# Patient Record
Sex: Male | Born: 1970 | Race: White | Hispanic: No | Marital: Single | Smoking: Current every day smoker
Health system: Southern US, Community
[De-identification: ages and names within clinical notes are randomized; demographics above are authoritative.]

## PROBLEM LIST (undated history)

## (undated) DIAGNOSIS — E34328 Other genetic causes of short stature: Secondary | ICD-10-CM

## (undated) DIAGNOSIS — N183 Chronic kidney disease, stage 3 unspecified: Secondary | ICD-10-CM

## (undated) DIAGNOSIS — I10 Essential (primary) hypertension: Secondary | ICD-10-CM

## (undated) HISTORY — PX: CHOLECYSTECTOMY: SHX55

---

## 2001-01-02 ENCOUNTER — Emergency Department (HOSPITAL_COMMUNITY): Admission: EM | Admit: 2001-01-02 | Discharge: 2001-01-02 | Payer: Self-pay | Admitting: Emergency Medicine

## 2001-01-02 ENCOUNTER — Encounter: Payer: Self-pay | Admitting: Emergency Medicine

## 2001-04-26 ENCOUNTER — Encounter: Payer: Self-pay | Admitting: Emergency Medicine

## 2001-04-26 ENCOUNTER — Emergency Department (HOSPITAL_COMMUNITY): Admission: EM | Admit: 2001-04-26 | Discharge: 2001-04-26 | Payer: Self-pay | Admitting: Emergency Medicine

## 2005-02-15 ENCOUNTER — Emergency Department (HOSPITAL_COMMUNITY): Admission: EM | Admit: 2005-02-15 | Discharge: 2005-02-15 | Payer: Self-pay | Admitting: Emergency Medicine

## 2005-05-08 ENCOUNTER — Emergency Department (HOSPITAL_COMMUNITY): Admission: EM | Admit: 2005-05-08 | Discharge: 2005-05-08 | Payer: Self-pay | Admitting: Emergency Medicine

## 2005-05-18 ENCOUNTER — Encounter: Admission: RE | Admit: 2005-05-18 | Discharge: 2005-05-18 | Payer: Self-pay | Admitting: Orthopedic Surgery

## 2005-06-30 ENCOUNTER — Encounter: Admission: RE | Admit: 2005-06-30 | Discharge: 2005-06-30 | Payer: Self-pay | Admitting: Orthopaedic Surgery

## 2009-03-12 ENCOUNTER — Emergency Department (HOSPITAL_COMMUNITY): Admission: EM | Admit: 2009-03-12 | Discharge: 2009-03-12 | Payer: Self-pay | Admitting: Emergency Medicine

## 2012-10-28 ENCOUNTER — Emergency Department: Payer: Self-pay | Admitting: Emergency Medicine

## 2013-09-16 ENCOUNTER — Inpatient Hospital Stay: Payer: Self-pay | Admitting: Surgery

## 2013-09-16 LAB — CBC
MCH: 30.4 pg (ref 26.0–34.0)
MCHC: 35.6 g/dL (ref 32.0–36.0)
MCV: 86 fL (ref 80–100)
Platelet: 359 10*3/uL (ref 150–440)
RDW: 12.6 % (ref 11.5–14.5)

## 2013-09-16 LAB — COMPREHENSIVE METABOLIC PANEL
Albumin: 4.1 g/dL (ref 3.4–5.0)
Alkaline Phosphatase: 167 U/L — ABNORMAL HIGH (ref 50–136)
Bilirubin,Total: 1.7 mg/dL — ABNORMAL HIGH (ref 0.2–1.0)
Chloride: 101 mmol/L (ref 98–107)
Co2: 27 mmol/L (ref 21–32)
EGFR (African American): >60
EGFR (Non-African Amer.): >60
Osmolality: 268 (ref 275–301)
SGOT(AST): 210 U/L — ABNORMAL HIGH (ref 15–37)

## 2013-09-16 LAB — URINALYSIS, COMPLETE
Bacteria: NONE SEEN
Blood: NEGATIVE
Glucose,UR: NEGATIVE mg/dL (ref 0–75)
Leukocyte Esterase: NEGATIVE
Nitrite: NEGATIVE
Specific Gravity: 1.019 (ref 1.003–1.030)
WBC UR: NONE SEEN /HPF (ref 0–5)

## 2013-09-17 LAB — CBC WITH DIFFERENTIAL/PLATELET
Basophil %: 0.8 %
HGB: 15.4 g/dL (ref 13.0–18.0)
Lymphocyte #: 1 10*3/uL (ref 1.0–3.6)
Monocyte #: 1.2 x10 3/mm — ABNORMAL HIGH (ref 0.2–1.0)
Neutrophil %: 80.2 %
RBC: 4.95 10*6/uL (ref 4.40–5.90)
RDW: 12.5 % (ref 11.5–14.5)

## 2013-09-17 LAB — COMPREHENSIVE METABOLIC PANEL
Anion Gap: 6 — ABNORMAL LOW (ref 7–16)
Bilirubin,Total: 5.6 mg/dL — ABNORMAL HIGH (ref 0.2–1.0)
Chloride: 98 mmol/L (ref 98–107)
Co2: 27 mmol/L (ref 21–32)
Creatinine: 0.68 mg/dL (ref 0.60–1.30)
SGPT (ALT): 333 U/L — ABNORMAL HIGH (ref 12–78)

## 2013-09-18 LAB — COMPREHENSIVE METABOLIC PANEL
Albumin: 3.2 g/dL — ABNORMAL LOW (ref 3.4–5.0)
Alkaline Phosphatase: 306 U/L — ABNORMAL HIGH (ref 50–136)
Calcium, Total: 8.8 mg/dL (ref 8.5–10.1)
Co2: 24 mmol/L (ref 21–32)
EGFR (African American): >60
Glucose: 87 mg/dL (ref 65–99)
Osmolality: 268 (ref 275–301)
Potassium: 3.7 mmol/L (ref 3.5–5.1)
Sodium: 135 mmol/L — ABNORMAL LOW (ref 136–145)
Total Protein: 6.7 g/dL (ref 6.4–8.2)

## 2013-09-18 LAB — CBC WITH DIFFERENTIAL/PLATELET
Basophil #: 0 10*3/uL (ref 0.0–0.1)
Basophil %: 0.4 %
Eosinophil %: 0.7 %
HGB: 13.6 g/dL (ref 13.0–18.0)
Lymphocyte %: 8.7 %
MCH: 30.9 pg (ref 26.0–34.0)
MCHC: 35.7 g/dL (ref 32.0–36.0)
Monocyte %: 8.7 %
Neutrophil %: 81.5 %
Platelet: 287 10*3/uL (ref 150–440)
RBC: 4.41 10*6/uL (ref 4.40–5.90)
RDW: 12.8 % (ref 11.5–14.5)

## 2013-09-18 LAB — PROTIME-INR
INR: 1.1
Prothrombin Time: 14 secs (ref 11.5–14.7)

## 2013-09-18 LAB — AMYLASE: Amylase: 673 U/L — ABNORMAL HIGH (ref 25–115)

## 2013-09-18 LAB — LIPASE, BLOOD: Lipase: 4956 U/L — ABNORMAL HIGH (ref 73–393)

## 2013-09-19 LAB — CBC WITH DIFFERENTIAL/PLATELET
Basophil %: 0.8 %
HCT: 36.3 % — ABNORMAL LOW (ref 40.0–52.0)
Lymphocyte %: 18.2 %
MCH: 31 pg (ref 26.0–34.0)
MCHC: 35.5 g/dL (ref 32.0–36.0)
Neutrophil #: 3.8 10*3/uL (ref 1.4–6.5)
Neutrophil %: 67.6 %

## 2013-09-19 LAB — COMPREHENSIVE METABOLIC PANEL
Albumin: 2.9 g/dL — ABNORMAL LOW (ref 3.4–5.0)
Anion Gap: 8 (ref 7–16)
Calcium, Total: 8.5 mg/dL (ref 8.5–10.1)
Chloride: 107 mmol/L (ref 98–107)
Co2: 24 mmol/L (ref 21–32)
Creatinine: 0.68 mg/dL (ref 0.60–1.30)
EGFR (Non-African Amer.): >60
Glucose: 73 mg/dL (ref 65–99)
Osmolality: 274 (ref 275–301)
SGOT(AST): 127 U/L — ABNORMAL HIGH (ref 15–37)
SGPT (ALT): 220 U/L — ABNORMAL HIGH (ref 12–78)

## 2013-09-19 LAB — AMYLASE: Amylase: 161 U/L — ABNORMAL HIGH (ref 25–115)

## 2013-09-20 LAB — COMPREHENSIVE METABOLIC PANEL
Alkaline Phosphatase: 251 U/L — ABNORMAL HIGH (ref 50–136)
Anion Gap: 6 — ABNORMAL LOW (ref 7–16)
Creatinine: 0.67 mg/dL (ref 0.60–1.30)
EGFR (African American): >60
EGFR (Non-African Amer.): >60
Glucose: 78 mg/dL (ref 65–99)
Osmolality: 275 (ref 275–301)
Potassium: 3.5 mmol/L (ref 3.5–5.1)
SGOT(AST): 101 U/L — ABNORMAL HIGH (ref 15–37)
SGPT (ALT): 212 U/L — ABNORMAL HIGH (ref 12–78)
Sodium: 140 mmol/L (ref 136–145)
Total Protein: 6.4 g/dL (ref 6.4–8.2)

## 2013-09-20 LAB — CBC WITH DIFFERENTIAL/PLATELET
Basophil #: 0.1 10*3/uL (ref 0.0–0.1)
HCT: 36 % — ABNORMAL LOW (ref 40.0–52.0)
HGB: 12.8 g/dL — ABNORMAL LOW (ref 13.0–18.0)
Lymphocyte %: 20.7 %
Monocyte #: 0.4 x10 3/mm (ref 0.2–1.0)
Neutrophil #: 3.3 10*3/uL (ref 1.4–6.5)
Neutrophil %: 64 %
WBC: 5.2 10*3/uL (ref 3.8–10.6)

## 2013-09-21 LAB — COMPREHENSIVE METABOLIC PANEL
Co2: 28 mmol/L (ref 21–32)
EGFR (African American): >60
Glucose: 90 mg/dL (ref 65–99)
Sodium: 137 mmol/L (ref 136–145)
Total Protein: 7 g/dL (ref 6.4–8.2)

## 2013-09-22 LAB — CBC WITH DIFFERENTIAL/PLATELET
Basophil %: 0.9 %
Eosinophil #: 0.3 10*3/uL (ref 0.0–0.7)
Eosinophil %: 5.4 %
MCH: 30.8 pg (ref 26.0–34.0)
Monocyte #: 0.5 x10 3/mm (ref 0.2–1.0)
Monocyte %: 7.7 %
Neutrophil %: 65.4 %
RDW: 12.5 % (ref 11.5–14.5)
WBC: 6.1 10*3/uL (ref 3.8–10.6)

## 2013-09-22 LAB — LIPASE, BLOOD: Lipase: 2845 U/L — ABNORMAL HIGH (ref 73–393)

## 2013-09-22 LAB — COMPREHENSIVE METABOLIC PANEL
Bilirubin,Total: 1.1 mg/dL — ABNORMAL HIGH (ref 0.2–1.0)
Calcium, Total: 9.2 mg/dL (ref 8.5–10.1)
Creatinine: 0.77 mg/dL (ref 0.60–1.30)
EGFR (African American): >60
EGFR (Non-African Amer.): >60
Glucose: 97 mg/dL (ref 65–99)
Osmolality: 269 (ref 275–301)
Potassium: 3.9 mmol/L (ref 3.5–5.1)
SGPT (ALT): 225 U/L — ABNORMAL HIGH (ref 12–78)

## 2013-09-23 LAB — COMPREHENSIVE METABOLIC PANEL
Albumin: 3 g/dL — ABNORMAL LOW (ref 3.4–5.0)
Alkaline Phosphatase: 217 U/L — ABNORMAL HIGH (ref 50–136)
Bilirubin,Total: 1.1 mg/dL — ABNORMAL HIGH (ref 0.2–1.0)
Calcium, Total: 8.7 mg/dL (ref 8.5–10.1)
Chloride: 105 mmol/L (ref 98–107)
Co2: 25 mmol/L (ref 21–32)
Creatinine: 0.73 mg/dL (ref 0.60–1.30)
EGFR (Non-African Amer.): >60
Glucose: 82 mg/dL (ref 65–99)
Osmolality: 272 (ref 275–301)
SGOT(AST): 130 U/L — ABNORMAL HIGH (ref 15–37)
Sodium: 137 mmol/L (ref 136–145)

## 2013-09-23 LAB — LIPASE, BLOOD: Lipase: 760 U/L — ABNORMAL HIGH (ref 73–393)

## 2013-09-23 LAB — CBC WITH DIFFERENTIAL/PLATELET
Basophil #: 0.1 10*3/uL (ref 0.0–0.1)
Eosinophil #: 0.1 10*3/uL (ref 0.0–0.7)
HCT: 40.4 % (ref 40.0–52.0)
HGB: 14.3 g/dL (ref 13.0–18.0)
Lymphocyte %: 11.3 %
MCH: 31 pg (ref 26.0–34.0)
Monocyte #: 0.6 x10 3/mm (ref 0.2–1.0)
Neutrophil #: 7.8 10*3/uL — ABNORMAL HIGH (ref 1.4–6.5)
RDW: 12.4 % (ref 11.5–14.5)
WBC: 9.6 10*3/uL (ref 3.8–10.6)

## 2013-09-24 LAB — COMPREHENSIVE METABOLIC PANEL
Anion Gap: 5 — ABNORMAL LOW (ref 7–16)
BUN: 6 mg/dL — ABNORMAL LOW (ref 7–18)
Calcium, Total: 8.8 mg/dL (ref 8.5–10.1)
Chloride: 104 mmol/L (ref 98–107)
Creatinine: 0.74 mg/dL (ref 0.60–1.30)
EGFR (Non-African Amer.): >60
Osmolality: 265 (ref 275–301)
Total Protein: 7 g/dL (ref 6.4–8.2)

## 2013-09-24 LAB — CBC WITH DIFFERENTIAL/PLATELET
Basophil %: 0.4 %
MCHC: 34.7 g/dL (ref 32.0–36.0)
MCV: 88 fL (ref 80–100)
Monocyte #: 0.6 x10 3/mm (ref 0.2–1.0)
Monocyte %: 6.3 %
Neutrophil #: 7.9 10*3/uL — ABNORMAL HIGH (ref 1.4–6.5)
Neutrophil %: 81.8 %
RBC: 4.74 10*6/uL (ref 4.40–5.90)
RDW: 12.5 % (ref 11.5–14.5)

## 2013-09-25 LAB — COMPREHENSIVE METABOLIC PANEL
BUN: 8 mg/dL (ref 7–18)
Chloride: 103 mmol/L (ref 98–107)
Creatinine: 0.92 mg/dL (ref 0.60–1.30)
EGFR (African American): >60
EGFR (Non-African Amer.): >60
Glucose: 105 mg/dL — ABNORMAL HIGH (ref 65–99)
Potassium: 3.9 mmol/L (ref 3.5–5.1)
SGOT(AST): 44 U/L — ABNORMAL HIGH (ref 15–37)
SGPT (ALT): 151 U/L — ABNORMAL HIGH (ref 12–78)
Sodium: 135 mmol/L — ABNORMAL LOW (ref 136–145)

## 2013-09-25 LAB — CBC WITH DIFFERENTIAL/PLATELET
Basophil %: 0.5 %
Eosinophil #: 0.3 10*3/uL (ref 0.0–0.7)
Eosinophil %: 3.2 %
HGB: 14.8 g/dL (ref 13.0–18.0)
MCHC: 35.6 g/dL (ref 32.0–36.0)
Monocyte %: 7.5 %
RBC: 4.78 10*6/uL (ref 4.40–5.90)
RDW: 12.5 % (ref 11.5–14.5)
WBC: 10.2 10*3/uL (ref 3.8–10.6)

## 2013-09-26 LAB — COMPREHENSIVE METABOLIC PANEL
Albumin: 3.1 g/dL — ABNORMAL LOW (ref 3.4–5.0)
Alkaline Phosphatase: 164 U/L — ABNORMAL HIGH (ref 50–136)
Bilirubin,Total: 0.8 mg/dL (ref 0.2–1.0)
Creatinine: 0.75 mg/dL (ref 0.60–1.30)
EGFR (African American): >60
EGFR (Non-African Amer.): >60
Glucose: 96 mg/dL (ref 65–99)
Osmolality: 276 (ref 275–301)
SGPT (ALT): 105 U/L — ABNORMAL HIGH (ref 12–78)
Total Protein: 7.6 g/dL (ref 6.4–8.2)

## 2013-09-26 LAB — LIPASE, BLOOD: Lipase: 951 U/L — ABNORMAL HIGH (ref 73–393)

## 2013-09-27 LAB — PATHOLOGY REPORT

## 2013-09-27 LAB — LIPASE, BLOOD: Lipase: 1211 U/L — ABNORMAL HIGH (ref 73–393)

## 2015-03-16 NOTE — Consult Note (Signed)
Chief Complaint:  Subjective/Chief Complaint Pt denies abdominal pain, nausea, or vomiting.  No jaundice or pruritis.  Lipase 1285.  LFTS improving.  Tolerating full iquids well.  1 soft brown BM this am.   VITAL SIGNS/ANCILLARY NOTES: **Vital Signs.:   29-Oct-14 05:43  Vital Signs Type Routine  Temperature Temperature (F) 97.2  Celsius 36.2  Pulse Pulse 78  Respirations Respirations 19  Systolic BP Systolic BP 606  Diastolic BP (mmHg) Diastolic BP (mmHg) 89  Mean BP 113  Pulse Ox % Pulse Ox % 95  Pulse Ox Activity Level  At rest  Oxygen Delivery Room Air/ 21 %   Brief Assessment:  GEN well developed, well nourished, no acute distress, A/Ox3   Cardiac Regular   Respiratory normal resp effort   Gastrointestinal Normal   Gastrointestinal details normal Soft  Nontender  Nondistended  Bowel sounds normal  No rebound tenderness  No gaurding  No rigidity  No organomegaly   EXTR negative cyanosis/clubbing, negative edema   Additional Physical Exam Skin: warm, dry   Lab Results: Hepatic:  29-Oct-14 04:18   Bilirubin, Total  1.3  Alkaline Phosphatase  253  SGPT (ALT)  231  SGOT (AST)  118  Total Protein, Serum 7.0  Albumin, Serum  3.2  Routine Chem:  29-Oct-14 04:18   Glucose, Serum 90  BUN  5  Creatinine (comp) 0.78  Sodium, Serum 137  Potassium, Serum 3.6  Chloride, Serum 105  CO2, Serum 28  Calcium (Total), Serum 9.1  Osmolality (calc) 271  eGFR (African American) >60  eGFR (Non-African American) >60 (eGFR values <77m/min/1.73 m2 may be an indication of chronic kidney disease (CKD). Calculated eGFR is useful in patients with stable renal function. The eGFR calculation will not be reliable in acutely ill patients when serum creatinine is changing rapidly. It is not useful in  patients on dialysis. The eGFR calculation may not be applicable to patients at the low and high extremes of body sizes, pregnant women, and vegetarians.)  Anion Gap  4  Lipase  1285  (Result(s) reported on 21 Sep 2013 at 04:59AM.)   Assessment/Plan:  Assessment/Plan:  Assessment Gallstone pancreatitis: Resolving.  Clinically doing very well, however lipase & LFTs slowly trending down.   Plan 1) Continue protonix  2) Pt for cholecystectomy with IOC soon Please call if you have any questions or concerns   Electronic Signatures: JAndria Meuse(NP)  (Signed 29-Oct-14 10:47)  Authored: Chief Complaint, VITAL SIGNS/ANCILLARY NOTES, Brief Assessment, Lab Results, Assessment/Plan   Last Updated: 29-Oct-14 10:47 by JAndria Meuse(NP)

## 2015-03-16 NOTE — Consult Note (Signed)
Chief Complaint:  Subjective/Chief Complaint The patient denies pain today. His lipase was up with some of his LFT's down today. MRCP did not show a filling defect but did show dilation of CBD and PD.   VITAL SIGNS/ANCILLARY NOTES: **Vital Signs.:   26-Oct-14 05:14  Vital Signs Type Routine  Temperature Temperature (F) 99.2  Celsius 37.3  Temperature Source oral  Pulse Pulse 100  Respirations Respirations 18  Systolic BP Systolic BP 409  Diastolic BP (mmHg) Diastolic BP (mmHg) 83  Mean BP 110  Pulse Ox % Pulse Ox % 97  Pulse Ox Activity Level  At rest  Oxygen Delivery Room Air/ 21 %  *Intake and Output.:   Shift 26-Oct-14 15:00  Grand Totals Intake:  960 Output:      Net:  960 24 Hr.:  960  Oral Intake      In:  960  Length of Stay Totals Intake:  4898.7 Output:  1300    Net:  3598.7   Brief Assessment:  GEN well developed, well nourished, no acute distress   Respiratory normal resp effort   Gastrointestinal Normal   Additional Physical Exam Alert and orientated times 3   Lab Results: Hepatic:  26-Oct-14 04:27   Bilirubin, Total  6.8  Alkaline Phosphatase  306  SGPT (ALT)  260  SGOT (AST)  171  Total Protein, Serum 6.7  Albumin, Serum  3.2  Routine Chem:  26-Oct-14 04:27   Amylase, Serum  673 (Result(s) reported on 18 Sep 2013 at 05:59AM.)  Lipase  4956 (Result(s) reported on 18 Sep 2013 at 05:59AM.)  Glucose, Serum 87  BUN 8  Creatinine (comp) 0.78  Sodium, Serum  135  Potassium, Serum 3.7  Chloride, Serum 103  CO2, Serum 24  Calcium (Total), Serum 8.8  Osmolality (calc) 268  eGFR (African American) >60  eGFR (Non-African American) >60 (eGFR values <78m/min/1.73 m2 may be an indication of chronic kidney disease (CKD). Calculated eGFR is useful in patients with stable renal function. The eGFR calculation will not be reliable in acutely ill patients when serum creatinine is changing rapidly. It is not useful in  patients on dialysis. The eGFR  calculation may not be applicable to patients at the low and high extremes of body sizes, pregnant women, and vegetarians.)  Anion Gap 8  Routine Coag:  26-Oct-14 04:27   Prothrombin 14.0  INR 1.1 (INR reference interval applies to patients on anticoagulant therapy. A single INR therapeutic range for coumarins is not optimal for all indications; however, the suggested range for most indications is 2.0 - 3.0. Exceptions to the INR Reference Range may include: Prosthetic heart valves, acute myocardial infarction, prevention of myocardial infarction, and combinations of aspirin and anticoagulant. The need for a higher or lower target INR must be assessed individually. Reference: The Pharmacology and Management of the Vitamin K  antagonists: the seventh ACCP Conference on Antithrombotic and Thrombolytic Therapy. CWJXBJ.4782Sept:126 (3suppl): [id number] A HCT value >55% may artifactually increase the PT.  In one study,  the increase was an average of 25%. Reference:  "Effect on Routine and Special Coagulation Testing Values of Citrate Anticoagulant Adjustment in Patients with High HCT Values." American Journal of Clinical Pathology 2006;126:400-405.)  Activated PTT (APTT) 32.6 (A HCT value >55% may artifactually increase the APTT. In one study, the increase was an average of 19%. Reference: "Effect on Routine and Special Coagulation Testing Values of Citrate Anticoagulant Adjustment in Patients with High HCT Values." American Journal of Clinical Pathology 2006;126:400-405.)  Routine Hem:  26-Oct-14 04:27   WBC (CBC) 9.3  RBC (CBC) 4.41  Hemoglobin (CBC) 13.6  Hematocrit (CBC)  38.2  Platelet Count (CBC) 287  MCV 87  MCH 30.9  MCHC 35.7  RDW 12.8  Neutrophil % 81.5  Lymphocyte % 8.7  Monocyte % 8.7  Eosinophil % 0.7  Basophil % 0.4  Neutrophil #  7.6  Lymphocyte #  0.8  Monocyte # 0.8  Eosinophil # 0.1  Basophil # 0.0 (Result(s) reported on 18 Sep 2013 at 05:46AM.)    Assessment/Plan:  Assessment/Plan:  Assessment Gall stone pancreatitis with acute cholesystitis.   Plan The patient is feeling well. No complaints. MRCP without any stones. Follow LFT's and if continues to improve then consider lap chole. If not then will consider ERCP.   Electronic Signatures: Lucilla Lame (MD)  (Signed 26-Oct-14 11:19)  Authored: Chief Complaint, VITAL SIGNS/ANCILLARY NOTES, Brief Assessment, Lab Results, Assessment/Plan   Last Updated: 26-Oct-14 11:19 by Lucilla Lame (MD)

## 2015-03-16 NOTE — Consult Note (Signed)
Brief Consult Note: Diagnosis: Increasd LFT's.   Patient was seen by consultant.   Consult note dictated.   Comments: This patient appears to have acute cholecystitis and has had increased LFT's with a CBD on U/S that was 4mm and then was reported larger on CT. Agree with MRCP and if positive then an ERCP.  Electronic Signatures: Midge MiniumWohl, Treon Kehl (MD)  (Signed 25-Oct-14 10:35)  Authored: Brief Consult Note   Last Updated: 25-Oct-14 10:35 by Midge MiniumWohl, Oshen Wlodarczyk (MD)

## 2015-03-16 NOTE — Op Note (Signed)
PATIENT NAME:  Mitchell [name], [name] MR#:  [id number] DATE OF BIRTH:  [birth year]  DATE OF PROCEDURE:  09/22/2013  PREOPERATIVE DIAGNOSIS: Biliary pancreatitis.   POSTOPERATIVE DIAGNOSIS: Biliary pancreatitis and chronic cholecystitis.   PROCEDURE: Laparoscopic cholecystectomy with C-arm fluoroscopic cholangiography.   SURGEON: Birgit Nowling E. Excell Seltzerooper, M.D.   ANESTHESIA: General with endotracheal tube.   INDICATIONS: This is a patient with a history of biliary pancreatitis with elevated liver function tests. Preoperatively, he and I have discussed the rationale for surgery, the options of observation, risk of bleeding, infection, recurrence of symptoms, the failure to resolve his  symptoms, open procedure, bile duct damage, bile duct leak, retained common bile duct stone, any of which could require further surgery and/or ERCP, stent and papillotomy. This was all reviewed for him in the preop holding area. He understood and agreed to proceed.   FINDINGS: Acute-on-chronic cholecystitis. C-arm fluoroscopic cholangiography demonstrated good flow in the duodenum. No intraluminal filling defects. Proximal ducts were identified and the cystic duct had been cannulated.   DESCRIPTION OF PROCEDURE: The patient was induced to general anesthesia, given IV antibiotics. VTE prophylaxis was in place. He was prepped and draped in a sterile fashion. Marcaine was infiltrated in skin and subcutaneous tissues around the supraumbilical area.   Incision was made. Veress needle was placed. Pneumoperitoneum was obtained. A 5 mm trocar port was placed. The abdominal cavity was explored, and under direct vision, a 10 mm epigastric port and two lateral 5 mm ports were placed. The gallbladder was placed on tension. Multiple adhesions were taken down bluntly. These were very scarified and dense fibrotic adhesions suggesting long-standing chronic cholecystitis.   Dissection down to the area of the infundibulum was performed. The  peritoneum over the infundibulum was incised bluntly. The cystic duct-gallbladder junction was well identified. The 2 branches of the cystic artery were doubly clipped and divided. This allowed for good visualization of the rather short cystic duct as it entered the infundibulum. Here, it was clipped and incised, and through a separate incision, an Angiocath cholangiogram catheter was placed and C-arm fluoroscopic cholangiography demonstrated the above. The cholangiogram catheter was then removed. The cystic duct was doubly clipped and divided and the gallbladder was taken from the gallbladder fossa with electrocautery and passed out through the epigastric port site with the aid of an Endo Catch bag.   The area was checked for hemostasis. Cautery was used on the bleeding gallbladder fossa and liver bed, and a piece of Surgicel was placed into this area. A 10 mm JP drain was placed into the area and brought out through a lateral port site. It was not possible to place it into the foramen of Winslow as there was so much scar in the area; therefore, it was placed in the right lateral gutter in a dependent position and tied in with 3-0 nylon.   Again, the area was checked for hemostasis and found to be adequate. The camera was placed in the epigastric site to view back at the periumbilical site. There was no sign of bleeding or bowel injury. Therefore, pneumoperitoneum was released. All ports were removed. Fascial edges at the epigastric site were approximated with 0 Vicryl figure-of-eight sutures and then skin staples were placed. Sterile dressing was placed and the drain was placed to bulb suction.   The patient tolerated the procedure well. There were no complications. He was taken to the recovery room in stable condition to be admitted for continued care.   ____________________________ Adah Salvageichard E. Excell Seltzerooper, MD  rec:np D: 09/22/2013 15:20:59 ET T: 09/22/2013 16:59:22 ET JOB#: [id number]  cc: Adah Salvage. Excell Seltzer,  MD, <Dictator> Lattie Haw MD ELECTRONICALLY SIGNED 09/22/2013 18:26

## 2015-03-16 NOTE — Consult Note (Signed)
Chief Complaint:  Subjective/Chief Complaint Pt denies any abdominal pain, nausea, or vomiting.  No jaundice or pruritis.  1 soft BM in 24 hrs.  LFTS improving.  K 3.4.   VITAL SIGNS/ANCILLARY NOTES: **Vital Signs.:   27-Oct-14 05:00  Vital Signs Type Routine  Temperature Temperature (F) 97.9  Celsius 36.6  Temperature Source oral  Pulse Pulse 86  Respirations Respirations 18  Systolic BP Systolic BP 765  Diastolic BP (mmHg) Diastolic BP (mmHg) 84  Mean BP 106  Pulse Ox % Pulse Ox % 96  Pulse Ox Activity Level  At rest  Oxygen Delivery Room Air/ 21 %   Brief Assessment:  GEN well developed, well nourished, no acute distress, A/Ox3   Cardiac Regular   Respiratory normal resp effort   Gastrointestinal Normal   Gastrointestinal details normal Soft  Nontender  Nondistended  Bowel sounds normal  No rebound tenderness  No gaurding  No rigidity  No organomegaly   EXTR negative cyanosis/clubbing, negative edema   Additional Physical Exam Skin: warm, dry   Lab Results:  Hepatic:  27-Oct-14 04:46   Bilirubin, Total  2.7  Alkaline Phosphatase  274  SGPT (ALT)  220  SGOT (AST)  127  Total Protein, Serum  6.3  Albumin, Serum  2.9  Routine Chem:  27-Oct-14 04:46   Lipase  768 (Result(s) reported on 19 Sep 2013 at 05:23AM.)  Amylase, Serum  161 (Result(s) reported on 19 Sep 2013 at 05:31AM.)  Glucose, Serum 73  BUN  6  Creatinine (comp) 0.68  Sodium, Serum 139  Potassium, Serum  3.4  Chloride, Serum 107  CO2, Serum 24  Calcium (Total), Serum 8.5  Osmolality (calc) 274  eGFR (African American) >60  eGFR (Non-African American) >60 (eGFR values <28m/min/1.73 m2 may be an indication of chronic kidney disease (CKD). Calculated eGFR is useful in patients with stable renal function. The eGFR calculation will not be reliable in acutely ill patients when serum creatinine is changing rapidly. It is not useful in  patients on dialysis. The eGFR calculation may not be  applicable to patients at the low and high extremes of body sizes, pregnant women, and vegetarians.)  Anion Gap 8  Routine Hem:  27-Oct-14 04:46   WBC (CBC) 5.6  RBC (CBC)  4.15  Hemoglobin (CBC)  12.9  Hematocrit (CBC)  36.3  Platelet Count (CBC) 273  MCV 87  MCH 31.0  MCHC 35.5  RDW 12.7  Neutrophil % 67.6  Lymphocyte % 18.2  Monocyte % 8.8  Eosinophil % 4.6  Basophil % 0.8  Neutrophil # 3.8  Lymphocyte # 1.0  Monocyte # 0.5  Eosinophil # 0.3  Basophil # 0.0 (Result(s) reported on 19 Sep 2013 at 05:23AM.)   Assessment/Plan:  Assessment/Plan:  Assessment Gallstone pancreatitis:  LFTS/lipase improving.  Doing well.   Plan 1) Continue protonix  2) Follow LFTS 3) Pt for cholecystectomy with IOC once pancreas cools off Please call if you have any questions or concerns   Electronic Signatures: JAndria Meuse(NP)  (Signed 27-Oct-14 11:31)  Authored: Chief Complaint, VITAL SIGNS/ANCILLARY NOTES, Brief Assessment, Lab Results, Assessment/Plan   Last Updated: 27-Oct-14 11:31 by JAndria Meuse(NP)

## 2015-03-16 NOTE — Consult Note (Signed)
Chief Complaint:  Subjective/Chief Complaint Patient reports feeling well except for post Op pain.LFT's better but lipase still elevated.   VITAL SIGNS/ANCILLARY NOTES: **Vital Signs.:   03-Nov-14 14:26  Vital Signs Type Routine  Temperature Temperature (F) 98.1  Celsius 36.7  Temperature Source oral  Pulse Pulse 94  Respirations Respirations 18  Systolic BP Systolic BP 009  Diastolic BP (mmHg) Diastolic BP (mmHg) 80  Mean BP 100  Pulse Ox % Pulse Ox % 94  Pulse Ox Activity Level  At rest  Oxygen Delivery Room Air/ 21 %   Brief Assessment:  GEN well developed, well nourished, no acute distress, critically ill appearing   Respiratory normal resp effort   Gastrointestinal Normal   Gastrointestinal details normal Soft  Tender   Lab Results: Hepatic:  03-Nov-14 07:42   Bilirubin, Total 0.8  Alkaline Phosphatase  164  SGPT (ALT)  105  SGOT (AST) 27  Total Protein, Serum 7.6  Albumin, Serum  3.1  Routine Chem:  03-Nov-14 07:42   Glucose, Serum 96  BUN 15  Creatinine (comp) 0.75  Sodium, Serum 138  Potassium, Serum 4.3  Chloride, Serum 107  CO2, Serum 26  Calcium (Total), Serum 9.6  Osmolality (calc) 276  eGFR (African American) >60  eGFR (Non-African American) >60 (eGFR values <42m/min/1.73 m2 may be an indication of chronic kidney disease (CKD). Calculated eGFR is useful in patients with stable renal function. The eGFR calculation will not be reliable in acutely ill patients when serum creatinine is changing rapidly. It is not useful in  patients on dialysis. The eGFR calculation may not be applicable to patients at the low and high extremes of body sizes, pregnant women, and vegetarians.)  Anion Gap  5  Lipase  951 (Result(s) reported on 26 Sep 2013 at 08:19AM.)   Assessment/Plan:  Assessment/Plan:  Assessment Gall stone pancreatitis.   Plan The LFT's are coming down but the lipase is still elevated. The patient is having post op pain but no symptoms of  pancreatic pain. Tolerating PO's. No nausea or vomiting. Would continue current treatment and follow labs.   Electronic Signatures: WLucilla Lame(MD)  (Signed [phone])  Authored: Chief Complaint, VITAL SIGNS/ANCILLARY NOTES, Brief Assessment, Lab Results, Assessment/Plan   Last Updated: 03-Nov-14 18:31 by WLucilla Lame(MD)

## 2015-03-16 NOTE — Consult Note (Signed)
PATIENT NAME:  Mitchell [name], Mitchell [name] MR#:  [id number] DATE OF BIRTH:  [birth year]  DATE OF ADMISSION:  09/16/2013  DATE OF CONSULTATION:  09/17/2013  CONSULTING SERVICE: Gastroenterology    CONSULTING PHYSICIAN:  Lucilla Lame, MD  REASON FOR CONSULTATION: Abnormal liver enzymes.   HISTORY OF PRESENT ILLNESS: This patient is a 44 year old gentleman who came with epigastric pain with nausea and vomiting. The patient was found to have an abnormal CT scan, which appeared to show acute cholecystitis. The patient was seen by Surgery, and repeat liver enzymes showed the patient's liver enzymes to have increased, with a bilirubin at admission being 1.7, and today was 5.6. The patient's alk phos also went from 167 to 254. His AST and ALT have also been elevated, with this morning's AST of 233 and ALT of 333. The patient's white cell count was elevated at 15.5, which is now down to 11.9 today. The patient denies ever having problems with his gallbladder in the past. He also denies any black stools or bloody stools. When his pain started, the patient reports that he thought he was just constipated and that was causing his pain, but the pain continued to progress despite taking laxatives.   PAST MEDICAL HISTORY: Dwarfism.   HOME MEDICATIONS: Ex-Lax p.r.n.   ALLERGIES: No known drug allergies.   SOCIAL HISTORY: Denies tobacco, alcohol or drug use.   FAMILY HISTORY: Noncontributory.  REVIEW OF SYSTEMS:  A 10-point review of systems is negative, except as stated above.   PHYSICAL EXAMINATION: GENERAL: The patient is lying in bed in no apparent distress.  VITAL SIGNS: Temperature 98.5, pulse 97, respirations 20, blood pressure 176/102, pulse oximetry 94%.  HEENT: Normocephalic, atraumatic. Extraocular motor intact. Pupils equally round, reactive to light and accommodation, without JVD, without lymphadenopathy.  LUNGS: Clear to auscultation bilaterally.  HEART: Regular rate and rhythm, without murmurs, rubs or  gallops.  ABDOMEN: Soft, with diffuse tenderness mostly in the epigastric area and the right upper quadrant. The rest of the abdominal tenderness is mild.  EXTREMITIES: Without cyanosis, clubbing or edema.  NEUROLOGIC EXAM: Grossly intact.  SKIN: Without any rashes or lesions.   ANCILLARY SERVICES: As stated above.   ASSESSMENT AND PLAN: This patient is a 44 year old gentleman with abdominal pain that was epigastric and in the right upper quadrant. The patient had a CT scan suggestive of acute cholecystitis. The patient's LFTs were increasing; therefore, a GI consult was called. The ultrasound and CT scan did show progression of the common bile duct with the latter exam showing a large common bile duct. The patient should undergo an MRCP for possible common bile duct stone. If the bile duct does show a filling defect, then the patient will need an ERCP, and he has been explained this. If it does not, then consideration of a cholecystectomy by Surgery would be next, and an intraoperative cholangiogram may be attempted. The patient has been explained the plan, and agrees with it.   Thank you very much for involving me in the care of this patient. If you have any questions, please do not hesitate to call.    ____________________________ Lucilla Lame, MD dw:mr D: 09/17/2013 17:58:55 ET T: 09/17/2013 19:27:16 ET JOB#: [id number]  cc: Lucilla Lame, MD, <Dictator> Lucilla Lame MD ELECTRONICALLY SIGNED 09/19/2013 9:53

## 2015-03-16 NOTE — H&P (Signed)
PATIENT NAME:  [name], [name] MR#:  [id number] DATE OF BIRTH:  09/12/1971  DATE OF ADMISSION:  09/16/2013 DATE OF CONSULTATION: 09/16/2013    CONSULTING PHYSICIAN: Dr. Chris Lundquist.   REASON FOR CONSULTATION: Epigastric pain x 2 weeks, nausea, vomiting, constipation.   HISTORY OF PRESENT ILLNESS: Mr. Mitchell [name] is a pleasant 44-year-old male with relatively no past medical history, who presents with approximately 2 weeks of worsening epigastric pain. He says that his pain began acutely about 2 weeks ago. He says that it is constant, not associated with meals. Initially thought it was due to constipation; however, he took laxatives and it did not improve, also with subjective fever, also with some nausea and 1 episode of vomiting. Last bowel movement 3 days ago, but did improve with a laxative. Does hurt more with movement. Did have episodes similar to this back in 1980s. Otherwise, no headaches, chills, chest pain, shortness of breath, cough, dysuria or hematuria.   Temperature 99, pulse 108, blood pressure 181/91, respirations 18, 98% on room air.    PAST MEDICAL HISTORY:   1.  Dwarfism. 2.  History of strabismus surgery.  HOME MEDICATIONS:  Ex lax regular strength p.r.n. constipation.   ALLERGIES: No known drug allergies.   SOCIAL HISTORY: Denies tobacco or drug or alcohol use. Is a cashier.   FAMILY HISTORY: Denies diabetes, high blood pressure, cancers.   REVIEW OF SYSTEMS:  A 12-point review of systems  was obtained. Pertinent positives and negatives as above.   PHYSICAL EXAMINATION: VITAL SIGNS: Temperature 99, pulse 108, blood pressure 181/91, respirations 18.  GENERAL: No acute distress. Alert and oriented x 3.  HEAD: Normocephalic, atraumatic.  EYES: No scleral icterus. No conjunctivitis.  FACE: No obvious facial trauma. Normal external nose. Normal external ears.  CHEST: Lungs clear to auscultation and moving air well.  HEART: Regular rate and rhythm. No murmurs, rubs or  gallops.  ABDOMEN:  Soft.  Tender to palpation in the epigastrium, worse in right upper quadrant. Has pain in the right upper quadrant when pushing left upper quadrant.  EXTREMITIES: Moves all extremities well. Strength 5/5.  NEUROLOGIC: Cranial nerves II through XII grossly intact.   LABORATORY DATA: White cell count 15.5, hemoglobin 16.1, hematocrit 45.3, platelets 359. BMP is normal. Creatinine is 0.76. Bilirubin 1.7, alk phos 167, AST 210, ALT 117.   CT scan shows pericholecystic fat stranding and fluid and edema. Stomach is mildly distended. Ultrasound shows a mildly thickened gallbladder wall with gallstones and sludge. Mild pericholecystic fluid. No common bile duct dilatation, however, does have mild intrahepatic bile duct dilatation.   ASSESSMENT AND PLAN:  [name] is a pleasant 44-year-old male who presents with 2 weeks of epigastric and right upper quadrant pain. Does have radiographic findings, particularly on CT, suggestive of cholecystitis. Clinically is consistent with cholecystitis with a white cell count, as well as LFTs. We will admit for IV antibiotics, resuscitation. Labs in a.m. Bilirubin continues to increase. We will consult GI. If improves, will likely need cholecystectomy. We will defer surgical management to daytime surgeon, Dr. Marterre.    ____________________________ Christopher A. Lundquist, MD cal:dmm D: 09/16/2013 21:38:50 ET T: 09/16/2013 22:08:39 ET JOB#: [id number]  cc: Christopher A. Lundquist, MD, <Dictator> CHRISTOPHER A LUNDQUIST MD ELECTRONICALLY SIGNED 09/18/2013 19:22 

## 2015-03-16 NOTE — Discharge Summary (Signed)
PATIENT NAME:  Revonda StandardFLOYD, [name] MR#:  [id number] DATE OF BIRTH:  [birth year]  DATE OF ADMISSION:  09/16/2013 DATE OF DISCHARGE:  09/27/2013  DIAGNOSES: Choledocholithiasis, biliary pancreatitis, dwarfism.  CONSULTANTS:  Dr. Daleen SquibbWall, gastroenterology.   PROCEDURES: Laparoscopic cholecystectomy with cholangiography.   HISTORY OF PRESENT ILLNESS AND HOSPITAL COURSE: This is a patient with a history of biliary colic, who presents with signs of choledocholithiasis. He was observed for a short period of time, with obvious biliary pancreatitis. His enzymes continued to be high, in the 400 to 600 range, and ultimately it was decided that laparoscopic cholecystectomy was indicated. A cholangiogram was performed, showing no intraluminal filling defects and acute cholecystitis. He made an uncomplicated postoperative recovery, but his lipase remained elevated, with normal liver function tests, but he is tolerating a regular diet and was discharged in stable condition to follow up in our office in 10 days.     ____________________________ Adah Salvageichard E. Excell Seltzerooper, MD rec:cg D: 10/06/2013 19:15:58 ET T: 10/07/2013 03:52:06 ET JOB#: [id number]  cc: Adah Salvageichard E. Excell Seltzerooper, MD, <Dictator> Lattie HawICHARD E Jaidalyn Schillo MD ELECTRONICALLY SIGNED 10/07/2013 6:44

## 2015-03-16 NOTE — Consult Note (Signed)
Chief Complaint:  Subjective/Chief Complaint Pt denies any abdominal pain, nausea, or vomiting.  No jaundice or pruritis.  Lipase 702.  LFTS improving.   VITAL SIGNS/ANCILLARY NOTES: **Vital Signs.:   28-Oct-14 05:41  Temperature Temperature (F) 98.3  Celsius 36.8  Temperature Source oral  Pulse Pulse 87  Respirations Respirations 17  Systolic BP Systolic BP 973  Diastolic BP (mmHg) Diastolic BP (mmHg) 86  Mean BP 108  Pulse Ox % Pulse Ox % 97  Pulse Ox Activity Level  At rest  Oxygen Delivery Room Air/ 21 %   Brief Assessment:  GEN well developed, well nourished, no acute distress, A/Ox3   Cardiac Regular   Respiratory normal resp effort   Gastrointestinal Normal   Gastrointestinal details normal Soft  Nontender  Nondistended  Bowel sounds normal  No rebound tenderness  No gaurding  No rigidity  No organomegaly   EXTR negative cyanosis/clubbing, negative edema   Additional Physical Exam Skin: warm, dry   Lab Results: Hepatic:  28-Oct-14 04:21   Bilirubin, Total  1.7  Alkaline Phosphatase  251  SGPT (ALT)  212  SGOT (AST)  101  Total Protein, Serum 6.4  Albumin, Serum  2.9  Routine Chem:  28-Oct-14 04:21   BUN  4  Creatinine (comp) 0.67  Sodium, Serum 140  Potassium, Serum 3.5  Chloride, Serum  108  CO2, Serum 26  Calcium (Total), Serum 8.5  Osmolality (calc) 275  eGFR (African American) >60  eGFR (Non-African American) >60 (eGFR values <10m/min/1.73 m2 may be an indication of chronic kidney disease (CKD). Calculated eGFR is useful in patients with stable renal function. The eGFR calculation will not be reliable in acutely ill patients when serum creatinine is changing rapidly. It is not useful in  patients on dialysis. The eGFR calculation may not be applicable to patients at the low and high extremes of body sizes, pregnant women, and vegetarians.)  Anion Gap  6  Lipase  702 (Result(s) reported on 20 Sep 2013 at 05:13AM.)  Routine Hem:  28-Oct-14  04:21   WBC (CBC) 5.2  RBC (CBC)  4.15  Hemoglobin (CBC)  12.8  Hematocrit (CBC)  36.0  Platelet Count (CBC) 308  MCV 87  MCH 31.0  MCHC 35.7  RDW 12.4  Neutrophil % 64.0  Lymphocyte % 20.7  Monocyte % 7.4  Eosinophil % 6.6  Basophil % 1.3  Neutrophil # 3.3  Lymphocyte # 1.1  Monocyte # 0.4  Eosinophil # 0.3  Basophil # 0.1 (Result(s) reported on 20 Sep 2013 at 05:02AM.)   Assessment/Plan:  Assessment/Plan:  Assessment Gallstone pancreatitis:  LFTS/lipase improving.  Doing well.   Plan 1) Continue protonix  2) Follow LFTS 3) Pt for cholecystectomy with IOC once pancreas cools off Please call if you have any questions or concerns   Electronic Signatures: JAndria Meuse(NP)  (Signed 28-Oct-14 12:32)  Authored: Chief Complaint, VITAL SIGNS/ANCILLARY NOTES, Brief Assessment, Lab Results, Assessment/Plan   Last Updated: 28-Oct-14 12:32 by JAndria Meuse(NP)

## 2016-01-30 ENCOUNTER — Emergency Department
Admission: EM | Admit: 2016-01-30 | Discharge: 2016-01-30 | Disposition: A | Discharge status: Home / Self Care | Payer: Self-pay | Attending: Student | Admitting: Student

## 2016-01-30 ENCOUNTER — Emergency Department: Payer: Self-pay

## 2016-01-30 ENCOUNTER — Encounter: Payer: Self-pay | Admitting: Emergency Medicine

## 2016-01-30 DIAGNOSIS — R030 Elevated blood-pressure reading, without diagnosis of hypertension: Secondary | ICD-10-CM | POA: Insufficient documentation

## 2016-01-30 DIAGNOSIS — G8929 Other chronic pain: Secondary | ICD-10-CM | POA: Insufficient documentation

## 2016-01-30 DIAGNOSIS — F17210 Nicotine dependence, cigarettes, uncomplicated: Secondary | ICD-10-CM | POA: Insufficient documentation

## 2016-01-30 DIAGNOSIS — M549 Dorsalgia, unspecified: Secondary | ICD-10-CM | POA: Insufficient documentation

## 2016-01-30 LAB — CBC WITH DIFFERENTIAL/PLATELET
Basophils Absolute: 0.1 10*3/uL (ref 0–0.1)
Basophils Relative: 1 %
EOS ABS: 0.1 10*3/uL (ref 0–0.7)
Eosinophils Relative: 2 %
HCT: 43.3 % (ref 40.0–52.0)
HEMOGLOBIN: 15.1 g/dL (ref 13.0–18.0)
LYMPHS ABS: 1.4 10*3/uL (ref 1.0–3.6)
LYMPHS PCT: 27 %
MCH: 30.5 pg (ref 26.0–34.0)
MCHC: 34.9 g/dL (ref 32.0–36.0)
MCV: 87.5 fL (ref 80.0–100.0)
Monocytes Absolute: 0.4 10*3/uL (ref 0.2–1.0)
Monocytes Relative: 7 %
NEUTROS ABS: 3.3 10*3/uL (ref 1.4–6.5)
NEUTROS PCT: 63 %
Platelets: 265 10*3/uL (ref 150–440)
RBC: 4.95 MIL/uL (ref 4.40–5.90)
RDW: 13 % (ref 11.5–14.5)
WBC: 5.2 10*3/uL (ref 3.8–10.6)

## 2016-01-30 LAB — BASIC METABOLIC PANEL
ANION GAP: 9 (ref 5–15)
BUN: 7 mg/dL (ref 6–20)
CHLORIDE: 107 mmol/L (ref 101–111)
CO2: 21 mmol/L — ABNORMAL LOW (ref 22–32)
Calcium: 8.9 mg/dL (ref 8.9–10.3)
Creatinine, Ser: 0.48 mg/dL — ABNORMAL LOW (ref 0.61–1.24)
GFR calc non Af Amer: >60 mL/min (ref >60)
Glucose, Bld: 94 mg/dL (ref 65–99)
POTASSIUM: 3.6 mmol/L (ref 3.5–5.1)
SODIUM: 137 mmol/L (ref 135–145)

## 2016-01-30 LAB — SEDIMENTATION RATE: SED RATE: 4 mm/h (ref 0–15)

## 2016-01-30 MED ORDER — METHOCARBAMOL 750 MG PO TABS: 750.0000 mg | ORAL_TABLET | Freq: Four times a day (QID) | ORAL | Status: DC

## 2016-01-30 MED ORDER — MELOXICAM 7.5 MG PO TABS: 7.5000 mg | ORAL_TABLET | Freq: Every day | ORAL | Status: DC

## 2016-01-30 NOTE — ED Notes (Signed)
Back injury a year ago per friend.

## 2016-01-30 NOTE — ED Provider Notes (Signed)
[facility] Emergency Department Provider Note  ____________________________________________  Time seen: Approximately 12:03 PM  I have reviewed the triage vital signs and the nursing notes.   HISTORY  Chief Complaint Back Pain    HPI Mitchell [name] is a 45 y.o. male patient complain of chronic back pain for 1 year. Patient appears increased in the past week. Patient stated no provocative incident for his complaint. Patient stated pain radiates down both legs,*cramping in lower extremities. Patient stated a year ago while at work he flexed over to lift something felt a" pop" in his back. Patient denies any bladder bowel dysfunction. Patient state increased difficulty with ambulation. Patient rated his pain as a 8/10 and describes the pain as "crampy".   History reviewed. No pertinent past medical history.  There are no active problems to display for this patient.   Past Surgical History  Procedure Laterality Date  . Cholecystectomy      Current Outpatient Rx  Name  Route  Sig  Dispense  Refill  . meloxicam (MOBIC) 7.5 MG tablet   Oral   Take 1 tablet (7.5 mg total) by mouth daily.   30 tablet   0   . methocarbamol (ROBAXIN-750) 750 MG tablet   Oral   Take 1 tablet (750 mg total) by mouth 4 (four) times daily.   20 tablet   0     Allergies Review of patient's allergies indicates no known allergies.  No family history on file.  Social History Social History  Substance Use Topics  . Smoking status: Current Every Day Smoker    Types: Cigarettes  . Smokeless tobacco: None  . Alcohol Use: No    Review of Systems Constitutional: No fever/chills Eyes: No visual changes. ENT: No sore throat. Cardiovascular: Denies chest pain. Respiratory: Denies shortness of breath. Gastrointestinal: No abdominal pain.  No nausea, no vomiting.  No diarrhea.  No constipation. Genitourinary: Negative for dysuria. Musculoskeletal: Chronic back pain Skin:  Negative for rash. Neurological: Negative for headaches, focal weakness or numbness.    ____________________________________________   PHYSICAL EXAM:  VITAL SIGNS: ED Triage Vitals  Enc Vitals Group     BP 01/30/16 1142 164/96 mmHg     Pulse Rate 01/30/16 1142 74     Resp 01/30/16 1142 18     Temp 01/30/16 1142 98.4 F (36.9 C)     Temp Source 01/30/16 1142 Oral     SpO2 01/30/16 1142 97 %     Weight 01/30/16 1139 155 lb (70.308 kg)     Height 01/30/16 1139  (1.473 m)     Head Cir --      Peak Flow --      Pain Score 01/30/16 1139 8     Pain Loc --      Pain Edu? --      Excl. in GC? --     Constitutional: Alert and oriented. Well appearing and in no acute distress.  Eyes: Conjunctivae are normal. PERRL. EOMI. Head: Atraumatic. Nose: No congestion/rhinnorhea. Mouth/Throat: Mucous membranes are moist.  Oropharynx non-erythematous. Neck: No stridor. No cervical spine tenderness to palpation. Hematological/Lymphatic/Immunilogical: No cervical lymphadenopathy. Cardiovascular: Normal rate, regular rhythm. Grossly normal heart sounds.  Good peripheral circulation. Elevated blood pressure Respiratory: Normal respiratory effort.  No retractions. Lungs CTAB. Gastrointestinal: Soft and nontender. No distention. No abdominal bruits. No CVA tenderness. Musculoskeletal: No lower extremity tenderness nor edema.  No joint effusions. Neurologic:  Normal speech and language. No gross focal neurologic  deficits are appreciated. No gait instability. Skin:  Skin is warm, dry and intact. No rash noted. Psychiatric: Mood and affect are normal. Speech and behavior are normal.  ____________________________________________   LABS (all labs ordered are listed, but only abnormal results are displayed)  Labs Reviewed  BASIC METABOLIC PANEL - Abnormal; Notable for the following:    CO2 21 (*)    Creatinine, Ser 0.48 (*)    All other components within normal limits  CBC WITH  DIFFERENTIAL/PLATELET  SEDIMENTATION RATE   ____________________________________________  EKG   ____________________________________________  RADIOLOGY  Acute findings on x-ray. ____________________________________________   PROCEDURES  Procedure(s) performed: None  Critical Care performed: No  ____________________________________________   INITIAL IMPRESSION / ASSESSMENT AND PLAN / ED COURSE  Pertinent labs & imaging results that were available during my care of the patient were reviewed by me and considered in my medical decision making (see chart for details).  discussed x-rays and lab results with patient.  Chronic back pain. Patient given a prescription for Motrin and Robaxin. Patient [facility]. ____________________________________________   FINAL CLINICAL IMPRESSION(S) / ED DIAGNOSES  Final diagnoses:  Chronic back pain greater than 3 months duration      Joni ReiningRonald K Smith, PA-C 01/30/16 1344  Gayla DossEryka A Gayle, MD 01/30/16 1544

## 2016-01-30 NOTE — ED Notes (Addendum)
Developed lower back pain about 1 year ago d/t injury  Now states pain is worse w/o new injury.. States he thinks the pain is worse after standing or walking   Describes pain as cramping type pain from knees down

## 2016-01-30 NOTE — ED Notes (Signed)
Lower back pain, worsening over the past few weeks.

## 2017-11-02 IMAGING — CR DG LUMBAR SPINE COMPLETE 4+V
5 series · 5 of 5 positions shown · non-contrast
Comparison: None.

CLINICAL DATA: Back pain

EXAM:
LUMBAR SPINE - COMPLETE 4+ VIEW

[l-spine ap]
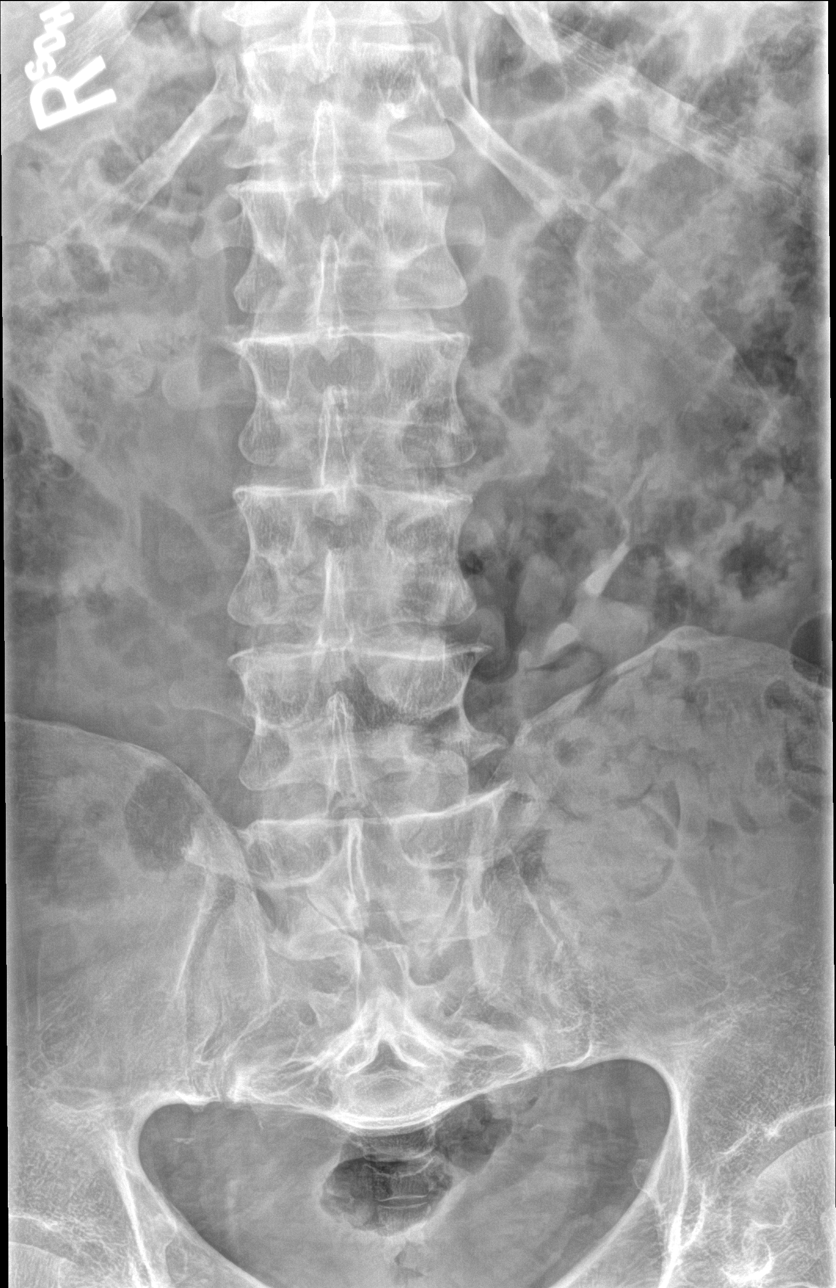

[l-spine obl (1 of 2)]
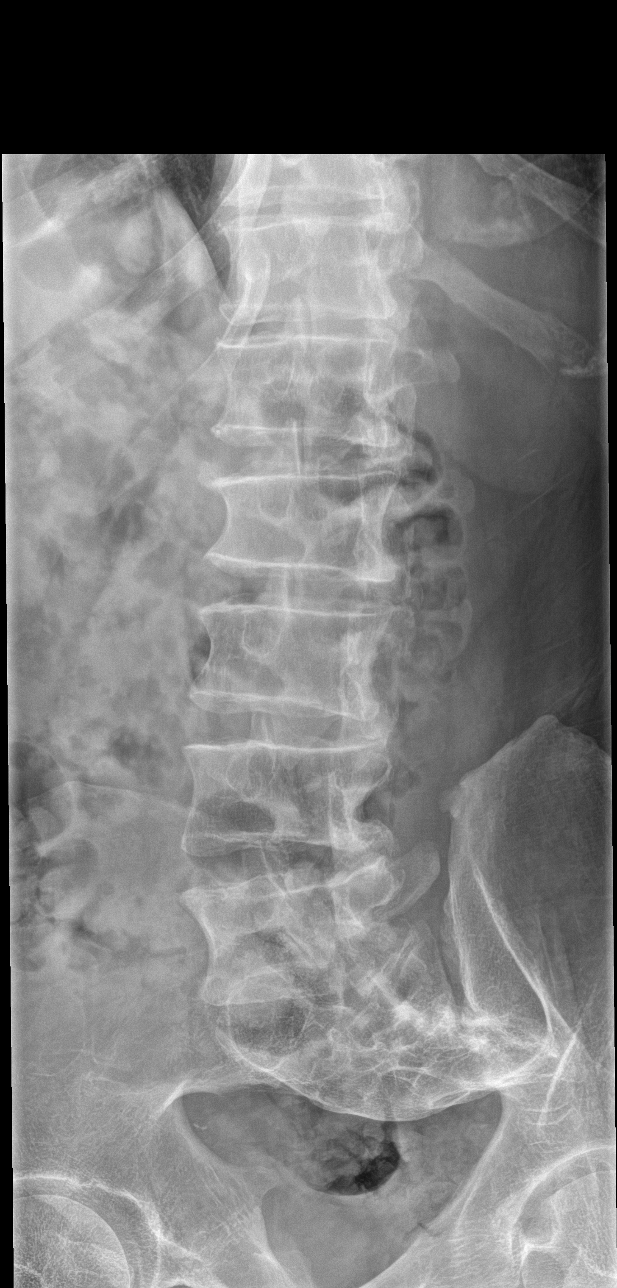

[l-spine obl (2 of 2)]
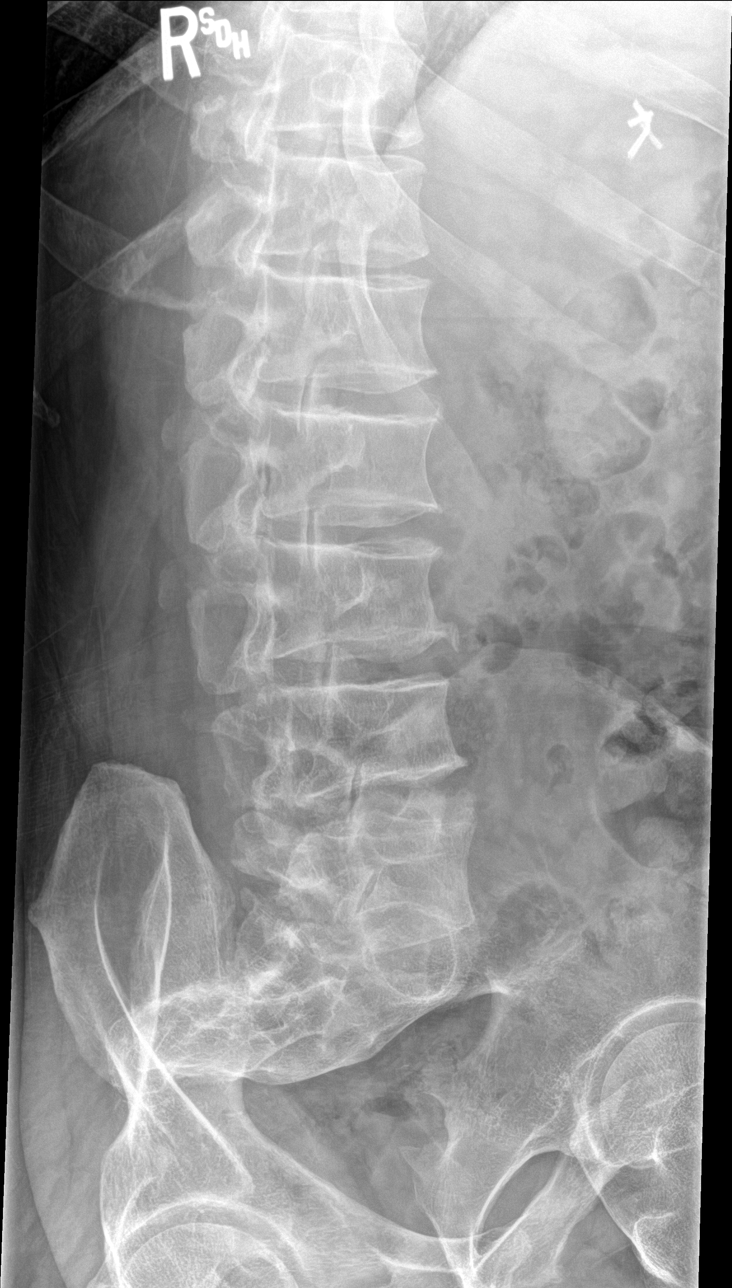

[l-spine lat]
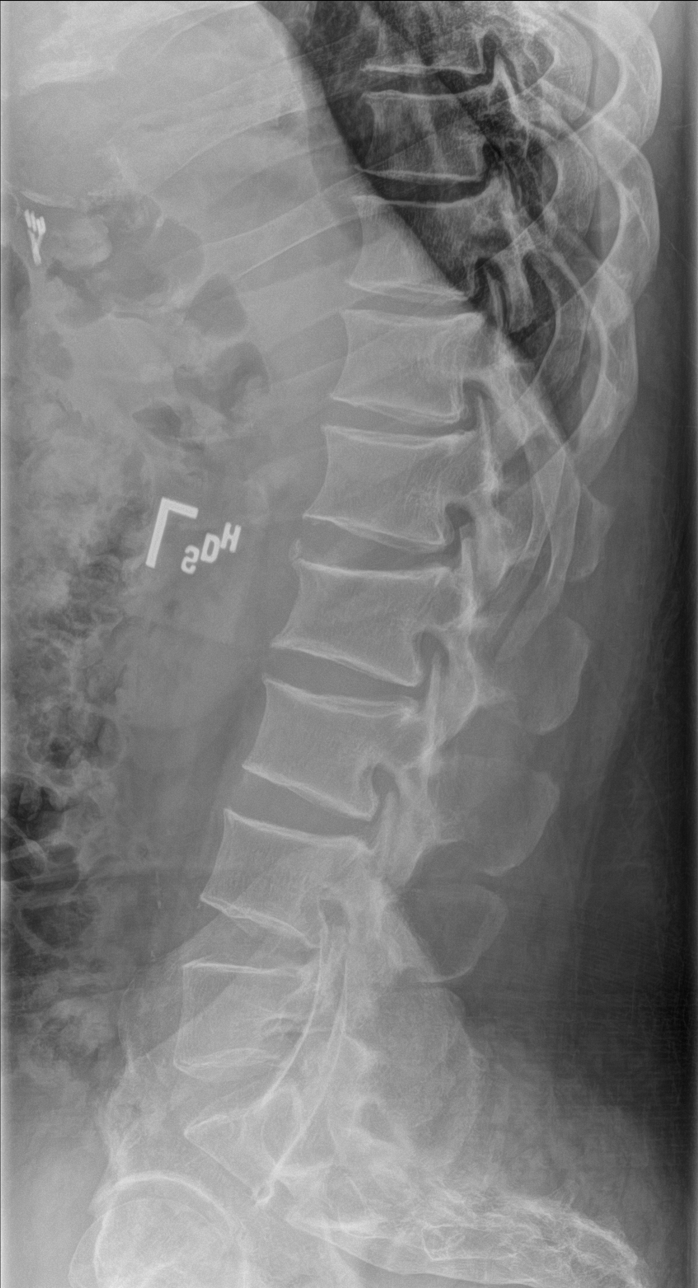

[l-spine spot]
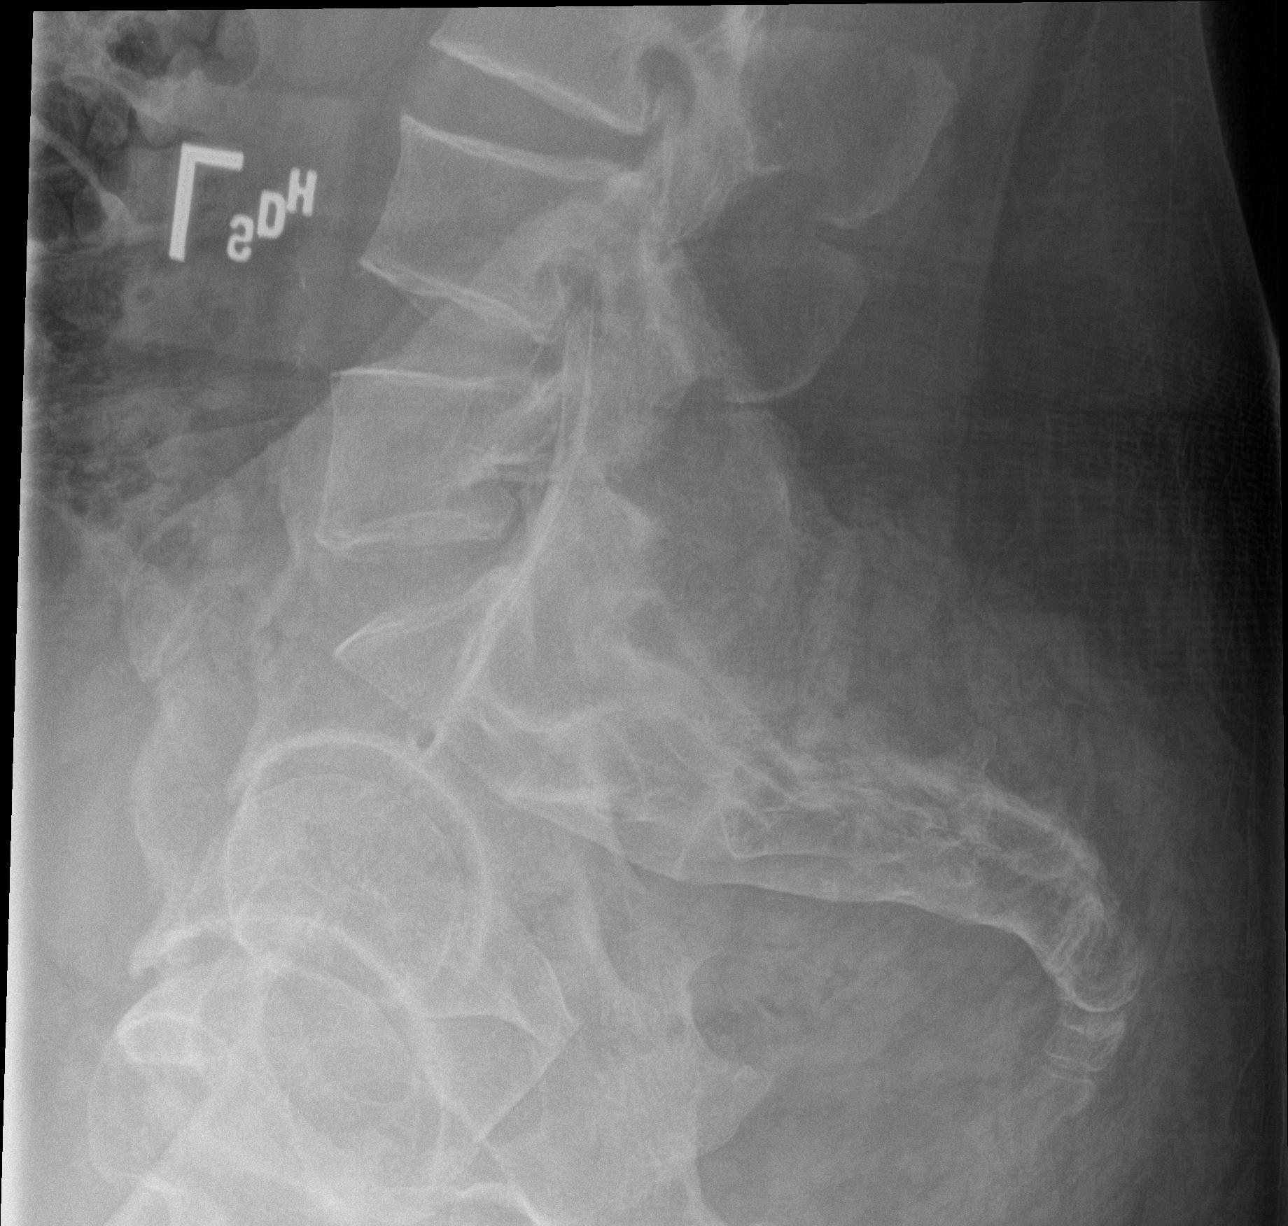

[5 of 5 positions shown; findings below may reference images not displayed]

FINDINGS: There is no evidence of lumbar spine fracture. Alignment is normal.
There is mild wedging of the anterior vertebrae with posterior
concavity. Intervertebral disc spaces are maintained.
IMPRESSION: 1. No acute findings.
2. Chronic bone changes compatible with the clinical history of
achondroplasia.

## 2018-11-06 DIAGNOSIS — Q789 Osteochondrodysplasia, unspecified: Secondary | ICD-10-CM

## 2018-12-02 DIAGNOSIS — Q7649 Other congenital malformations of spine, not associated with scoliosis: Secondary | ICD-10-CM | POA: Insufficient documentation

## 2018-12-02 DIAGNOSIS — M4710 Other spondylosis with myelopathy, site unspecified: Secondary | ICD-10-CM | POA: Diagnosis present

## 2020-01-07 DIAGNOSIS — Z789 Other specified health status: Secondary | ICD-10-CM | POA: Insufficient documentation

## 2021-03-21 DIAGNOSIS — F32A Depression, unspecified: Secondary | ICD-10-CM | POA: Insufficient documentation

## 2021-03-21 DIAGNOSIS — D649 Anemia, unspecified: Secondary | ICD-10-CM | POA: Diagnosis present

## 2021-03-21 DIAGNOSIS — E785 Hyperlipidemia, unspecified: Secondary | ICD-10-CM | POA: Insufficient documentation

## 2021-03-21 DIAGNOSIS — Z9359 Other cystostomy status: Secondary | ICD-10-CM

## 2021-12-20 DIAGNOSIS — G4733 Obstructive sleep apnea (adult) (pediatric): Secondary | ICD-10-CM | POA: Insufficient documentation

## 2023-06-28 DIAGNOSIS — T85628A Displacement of other specified internal prosthetic devices, implants and grafts, initial encounter: Secondary | ICD-10-CM | POA: Diagnosis not present

## 2023-06-28 DIAGNOSIS — D649 Anemia, unspecified: Secondary | ICD-10-CM | POA: Diagnosis not present

## 2023-06-28 DIAGNOSIS — Z79899 Other long term (current) drug therapy: Secondary | ICD-10-CM | POA: Diagnosis not present

## 2023-06-28 DIAGNOSIS — E785 Hyperlipidemia, unspecified: Secondary | ICD-10-CM | POA: Diagnosis not present

## 2023-06-28 DIAGNOSIS — E34328 Other genetic causes of short stature: Secondary | ICD-10-CM | POA: Diagnosis not present

## 2023-06-28 DIAGNOSIS — N189 Chronic kidney disease, unspecified: Secondary | ICD-10-CM | POA: Diagnosis not present

## 2023-06-28 DIAGNOSIS — X58XXXA Exposure to other specified factors, initial encounter: Secondary | ICD-10-CM | POA: Diagnosis not present

## 2023-07-12 DIAGNOSIS — Q774 Achondroplasia: Secondary | ICD-10-CM | POA: Diagnosis not present

## 2023-07-12 DIAGNOSIS — Z79899 Other long term (current) drug therapy: Secondary | ICD-10-CM | POA: Diagnosis not present

## 2023-07-12 DIAGNOSIS — Y846 Urinary catheterization as the cause of abnormal reaction of the patient, or of later complication, without mention of misadventure at the time of the procedure: Secondary | ICD-10-CM | POA: Diagnosis not present

## 2023-07-12 DIAGNOSIS — G4733 Obstructive sleep apnea (adult) (pediatric): Secondary | ICD-10-CM | POA: Diagnosis not present

## 2023-07-12 DIAGNOSIS — Z20822 Contact with and (suspected) exposure to covid-19: Secondary | ICD-10-CM | POA: Diagnosis not present

## 2023-07-12 DIAGNOSIS — I252 Old myocardial infarction: Secondary | ICD-10-CM | POA: Diagnosis not present

## 2023-07-12 DIAGNOSIS — R0902 Hypoxemia: Secondary | ICD-10-CM | POA: Diagnosis not present

## 2023-07-12 DIAGNOSIS — I959 Hypotension, unspecified: Secondary | ICD-10-CM | POA: Diagnosis not present

## 2023-07-12 DIAGNOSIS — N189 Chronic kidney disease, unspecified: Secondary | ICD-10-CM | POA: Diagnosis not present

## 2023-07-12 DIAGNOSIS — E785 Hyperlipidemia, unspecified: Secondary | ICD-10-CM | POA: Diagnosis not present

## 2023-07-12 DIAGNOSIS — G8250 Quadriplegia, unspecified: Secondary | ICD-10-CM | POA: Diagnosis not present

## 2023-07-12 DIAGNOSIS — Z7951 Long term (current) use of inhaled steroids: Secondary | ICD-10-CM | POA: Diagnosis not present

## 2023-07-12 DIAGNOSIS — T83011A Breakdown (mechanical) of indwelling urethral catheter, initial encounter: Secondary | ICD-10-CM | POA: Diagnosis not present

## 2023-08-26 DIAGNOSIS — Y92410 Unspecified street and highway as the place of occurrence of the external cause: Secondary | ICD-10-CM | POA: Diagnosis not present

## 2023-08-26 DIAGNOSIS — S40022A Contusion of left upper arm, initial encounter: Secondary | ICD-10-CM | POA: Diagnosis not present

## 2023-08-26 DIAGNOSIS — Q774 Achondroplasia: Secondary | ICD-10-CM | POA: Diagnosis not present

## 2023-08-26 DIAGNOSIS — I252 Old myocardial infarction: Secondary | ICD-10-CM | POA: Diagnosis not present

## 2023-08-26 DIAGNOSIS — N189 Chronic kidney disease, unspecified: Secondary | ICD-10-CM | POA: Diagnosis not present

## 2023-08-26 DIAGNOSIS — Z79899 Other long term (current) drug therapy: Secondary | ICD-10-CM | POA: Diagnosis not present

## 2023-08-26 DIAGNOSIS — D649 Anemia, unspecified: Secondary | ICD-10-CM | POA: Diagnosis not present

## 2023-10-21 DIAGNOSIS — I129 Hypertensive chronic kidney disease with stage 1 through stage 4 chronic kidney disease, or unspecified chronic kidney disease: Secondary | ICD-10-CM | POA: Diagnosis not present

## 2023-10-21 DIAGNOSIS — Z743 Need for continuous supervision: Secondary | ICD-10-CM | POA: Diagnosis not present

## 2023-10-21 DIAGNOSIS — N179 Acute kidney failure, unspecified: Secondary | ICD-10-CM | POA: Diagnosis not present

## 2023-10-21 DIAGNOSIS — N189 Chronic kidney disease, unspecified: Secondary | ICD-10-CM | POA: Diagnosis not present

## 2023-10-21 DIAGNOSIS — D649 Anemia, unspecified: Secondary | ICD-10-CM | POA: Diagnosis not present

## 2023-10-21 DIAGNOSIS — R944 Abnormal results of kidney function studies: Secondary | ICD-10-CM | POA: Diagnosis not present

## 2023-10-21 DIAGNOSIS — R6889 Other general symptoms and signs: Secondary | ICD-10-CM | POA: Diagnosis not present

## 2023-10-21 DIAGNOSIS — N390 Urinary tract infection, site not specified: Secondary | ICD-10-CM | POA: Diagnosis not present

## 2023-10-21 DIAGNOSIS — Z452 Encounter for adjustment and management of vascular access device: Secondary | ICD-10-CM | POA: Diagnosis not present

## 2023-10-22 DIAGNOSIS — D649 Anemia, unspecified: Secondary | ICD-10-CM | POA: Diagnosis not present

## 2023-10-22 DIAGNOSIS — R600 Localized edema: Secondary | ICD-10-CM | POA: Diagnosis not present

## 2023-10-22 DIAGNOSIS — N179 Acute kidney failure, unspecified: Secondary | ICD-10-CM | POA: Diagnosis not present

## 2023-10-22 DIAGNOSIS — I10 Essential (primary) hypertension: Secondary | ICD-10-CM | POA: Diagnosis not present

## 2023-10-23 DIAGNOSIS — I10 Essential (primary) hypertension: Secondary | ICD-10-CM | POA: Diagnosis not present

## 2023-10-23 DIAGNOSIS — D649 Anemia, unspecified: Secondary | ICD-10-CM | POA: Diagnosis not present

## 2023-10-23 DIAGNOSIS — N390 Urinary tract infection, site not specified: Secondary | ICD-10-CM | POA: Diagnosis not present

## 2023-10-23 DIAGNOSIS — N179 Acute kidney failure, unspecified: Secondary | ICD-10-CM | POA: Diagnosis not present

## 2023-10-25 DIAGNOSIS — I10 Essential (primary) hypertension: Secondary | ICD-10-CM | POA: Diagnosis not present

## 2023-10-25 DIAGNOSIS — N3001 Acute cystitis with hematuria: Secondary | ICD-10-CM | POA: Diagnosis not present

## 2023-10-25 DIAGNOSIS — N179 Acute kidney failure, unspecified: Secondary | ICD-10-CM | POA: Diagnosis not present

## 2023-10-25 DIAGNOSIS — D649 Anemia, unspecified: Secondary | ICD-10-CM | POA: Diagnosis not present

## 2023-10-25 DIAGNOSIS — L89326 Pressure-induced deep tissue damage of left buttock: Secondary | ICD-10-CM | POA: Diagnosis not present

## 2023-10-25 DIAGNOSIS — N390 Urinary tract infection, site not specified: Secondary | ICD-10-CM | POA: Diagnosis not present

## 2023-10-26 DIAGNOSIS — N179 Acute kidney failure, unspecified: Secondary | ICD-10-CM | POA: Diagnosis not present

## 2023-10-26 DIAGNOSIS — D649 Anemia, unspecified: Secondary | ICD-10-CM | POA: Diagnosis not present

## 2023-10-26 DIAGNOSIS — I10 Essential (primary) hypertension: Secondary | ICD-10-CM | POA: Diagnosis not present

## 2023-10-26 DIAGNOSIS — N390 Urinary tract infection, site not specified: Secondary | ICD-10-CM | POA: Diagnosis not present

## 2023-10-27 DIAGNOSIS — N3001 Acute cystitis with hematuria: Secondary | ICD-10-CM | POA: Diagnosis not present

## 2023-10-27 DIAGNOSIS — S31829A Unspecified open wound of left buttock, initial encounter: Secondary | ICD-10-CM | POA: Diagnosis not present

## 2023-10-30 DIAGNOSIS — Z20828 Contact with and (suspected) exposure to other viral communicable diseases: Secondary | ICD-10-CM | POA: Diagnosis not present

## 2023-10-30 DIAGNOSIS — E669 Obesity, unspecified: Secondary | ICD-10-CM | POA: Diagnosis not present

## 2023-10-30 DIAGNOSIS — Z9181 History of falling: Secondary | ICD-10-CM | POA: Diagnosis not present

## 2023-10-30 DIAGNOSIS — K829 Disease of gallbladder, unspecified: Secondary | ICD-10-CM | POA: Diagnosis not present

## 2023-10-30 DIAGNOSIS — K5900 Constipation, unspecified: Secondary | ICD-10-CM | POA: Diagnosis not present

## 2023-10-30 DIAGNOSIS — N139 Obstructive and reflux uropathy, unspecified: Secondary | ICD-10-CM | POA: Diagnosis not present

## 2023-10-30 DIAGNOSIS — Z981 Arthrodesis status: Secondary | ICD-10-CM | POA: Diagnosis not present

## 2023-10-30 DIAGNOSIS — N183 Chronic kidney disease, stage 3 unspecified: Secondary | ICD-10-CM | POA: Diagnosis not present

## 2023-10-30 DIAGNOSIS — Z1621 Resistance to vancomycin: Secondary | ICD-10-CM | POA: Diagnosis not present

## 2023-10-30 DIAGNOSIS — R600 Localized edema: Secondary | ICD-10-CM | POA: Diagnosis not present

## 2023-10-30 DIAGNOSIS — Z8673 Personal history of transient ischemic attack (TIA), and cerebral infarction without residual deficits: Secondary | ICD-10-CM | POA: Diagnosis not present

## 2023-11-13 DIAGNOSIS — N183 Chronic kidney disease, stage 3 unspecified: Secondary | ICD-10-CM | POA: Diagnosis not present

## 2023-11-13 DIAGNOSIS — K829 Disease of gallbladder, unspecified: Secondary | ICD-10-CM | POA: Diagnosis not present

## 2023-11-13 DIAGNOSIS — G4733 Obstructive sleep apnea (adult) (pediatric): Secondary | ICD-10-CM | POA: Diagnosis not present

## 2023-11-13 DIAGNOSIS — K5900 Constipation, unspecified: Secondary | ICD-10-CM | POA: Diagnosis not present

## 2023-11-13 DIAGNOSIS — Z9181 History of falling: Secondary | ICD-10-CM | POA: Diagnosis not present

## 2023-11-13 DIAGNOSIS — R600 Localized edema: Secondary | ICD-10-CM | POA: Diagnosis not present

## 2023-11-13 DIAGNOSIS — N3001 Acute cystitis with hematuria: Secondary | ICD-10-CM | POA: Diagnosis not present

## 2023-11-13 DIAGNOSIS — Z8673 Personal history of transient ischemic attack (TIA), and cerebral infarction without residual deficits: Secondary | ICD-10-CM | POA: Diagnosis not present

## 2023-11-13 DIAGNOSIS — Z20828 Contact with and (suspected) exposure to other viral communicable diseases: Secondary | ICD-10-CM | POA: Diagnosis not present

## 2023-11-13 DIAGNOSIS — E669 Obesity, unspecified: Secondary | ICD-10-CM | POA: Diagnosis not present

## 2023-11-13 DIAGNOSIS — Z981 Arthrodesis status: Secondary | ICD-10-CM | POA: Diagnosis not present

## 2023-12-11 ENCOUNTER — Encounter: Payer: Medicare Other | Attending: Physician Assistant | Admitting: Physician Assistant

## 2023-12-11 DIAGNOSIS — Q774 Achondroplasia: Secondary | ICD-10-CM | POA: Diagnosis not present

## 2023-12-11 DIAGNOSIS — L89324 Pressure ulcer of left buttock, stage 4: Secondary | ICD-10-CM | POA: Diagnosis not present

## 2023-12-11 DIAGNOSIS — L89314 Pressure ulcer of right buttock, stage 4: Secondary | ICD-10-CM | POA: Diagnosis present

## 2023-12-11 DIAGNOSIS — I129 Hypertensive chronic kidney disease with stage 1 through stage 4 chronic kidney disease, or unspecified chronic kidney disease: Secondary | ICD-10-CM | POA: Insufficient documentation

## 2023-12-11 DIAGNOSIS — L89154 Pressure ulcer of sacral region, stage 4: Secondary | ICD-10-CM | POA: Diagnosis not present

## 2023-12-11 DIAGNOSIS — N183 Chronic kidney disease, stage 3 unspecified: Secondary | ICD-10-CM | POA: Insufficient documentation

## 2023-12-15 NOTE — Progress Notes (Signed)
ENIO, VALLEE ([id number]) [id number] Page 1 of 5 Visit Report for 12/11/2023 Abuse Risk Screen Details Patient Name: Date of Service: Mitchell [name], [place] NA LD L. 12/11/2023 8:45 A M Medical Record Number: [id number] Patient Account Number: [id number] Date of Birth/Sex: Treating RN: 1971/07/02 (53 y.o. Mitchell [name]) Yevonne Pax Primary Care Havanna Groner: Lindwood Qua Other Clinician: Referring Shaquisha Wynn: Treating Crit Obremski/Extender: [name] in Treatment: 0 Abuse Risk Screen Items Answer ABUSE RISK SCREEN: Has anyone close to you tried to hurt or harm you recentlyo No Do you feel uncomfortable with anyone in your familyo No Has anyone forced you do things that you didnt want to doo No Electronic Signature(s) Signed: 12/15/2023 9:06:19 AM By: Yevonne Pax RN Entered By: Yevonne Pax on 12/11/2023 09:06:25 -------------------------------------------------------------------------------- Activities of Daily Living Details Patient Name: Date of Service: Mitchell [name], [place] NA LD L. 12/11/2023 8:45 A M Medical Record Number: [id number] Patient Account Number: [id number] Date of Birth/Sex: Treating RN: [birth year] (53 y.o. Mitchell [name] Primary Care Qiara Minetti: Lindwood Qua Other Clinician: Referring Jarious Lyon: Treating Lillianna Sabel/Extender: [name] in Treatment: 0 Activities of Daily Living Items Answer Activities of Daily Living (Please select one for each item) Drive Automobile Not Able T Medications ake Need Assistance Use T elephone Need Assistance Care for Appearance Need Assistance Use T oilet Need Assistance Bath / Shower Need Assistance Dress Self Need Assistance Feed Self Completely Able Walk Not Able Get In / Out Bed Need Assistance Housework Not Able [name], Mitchell ([id number]) [phone] [email or web address] Page 2 of 5 Prepare Meals Not Able Handle Money Not Able Shop for Self Not Able Electronic  Signature(s) Signed: 12/15/2023 9:06:19 AM By: Yevonne Pax RN Entered By: Yevonne Pax on 12/11/2023 09:07:10 -------------------------------------------------------------------------------- Education Screening Details Patient Name: Date of Service: Mitchell [name], [place] LD L. 12/11/2023 8:45 A M Medical Record Number: [id number] Patient Account Number: [id number] Date of Birth/Sex: Treating RN: 1971-11-15 (53 y.o. Mitchell [name] Primary Care Ronnie Mallette: Lindwood Qua Other Clinician: Referring Roderick Sweezy: Treating Trevor Duty/Extender: Bethann Goo in Treatment: 0 Primary Learner Assessed: Patient Learning Preferences/Education Level/Primary Language Learning Preference: Explanation Highest Education Level: High School Preferred Language: English Cognitive Barrier Language Barrier: No Translator Needed: No Memory Deficit: No Emotional Barrier: No Cultural/Religious Beliefs Affecting Medical Care: No Physical Barrier Impaired Vision: No Impaired Hearing: No Decreased Hand dexterity: No Knowledge/Comprehension Knowledge Level: Medium Comprehension Level: Medium Ability to understand written instructions: Medium Ability to understand verbal instructions: Medium Motivation Anxiety Level: Anxious Cooperation: Cooperative Education Importance: Acknowledges Need Interest in Health Problems: Asks Questions Perception: Coherent Willingness to Engage in Self-Management Medium Activities: Readiness to Engage in Self-Management Medium Activities: Electronic Signature(s) Signed: 12/15/2023 9:06:19 AM By: Yevonne Pax RN Entered By: Yevonne Pax on 12/11/2023 09:07:39 Oren Section ([id number]) [id number] Page 3 of 5 -------------------------------------------------------------------------------- Fall Risk Assessment Details Patient Name: Date of Service: [facility], [place] NA LD L. 12/11/2023 8:45 A M Medical Record Number: [id number] Patient  Account Number: [id number] Date of Birth/Sex: Treating RN: October 07, 1971 (53 y.o. Mitchell [name]) Yevonne Pax Primary Care Kaneesha Constantino: Lindwood Qua Other Clinician: Referring Perrion Diesel: Treating Bronsen Serano/Extender: [name] in Treatment: 0 Fall Risk Assessment Items Have you had 2 or more falls in the last 12 monthso 0 No Have you had any fall that resulted in injury in the last 12 monthso 0 No FALLS RISK SCREEN History of falling - immediate or within 3 months 0 No Secondary diagnosis (Do you have 2 or more medical  diagnoseso) 0 No Ambulatory aid None/bed rest/wheelchair/nurse 0 Yes Crutches/cane/walker 0 No Furniture 0 No Intravenous therapy Access/Saline/Heparin Lock 0 No Gait/Transferring Normal/ bed rest/ wheelchair 0 Yes Weak (short steps with or without shuffle, stooped but able to lift head while walking, may seek 0 No support from furniture) Impaired (short steps with shuffle, may have difficulty arising from chair, head down, impaired 0 No balance) Mental Status Oriented to own ability 0 Yes Electronic Signature(s) Signed: 12/15/2023 9:06:19 AM By: Yevonne Pax RN Entered By: Yevonne Pax on 12/11/2023 09:07:47 -------------------------------------------------------------------------------- Foot Assessment Details Patient Name: Date of Service: Mitchell [name], [place] LD L. 12/11/2023 8:45 A M Medical Record Number: [id number] Patient Account Number: [id number] Date of Birth/Sex: Treating RN: 02-06-1971 (52 y.o. Mitchell [name] Primary Care Jourden Gilson: Lindwood Qua Other Clinician: Referring Zoejane Gaulin: Treating Jozsef Wescoat/Extender: [name] in Treatment: 0 Foot Assessment Items Site Locations MAESYN, ISSAC ([id number]) [phone] [id number] Page 4 of 5 + = Sensation present, - = Sensation absent, C = Callus, U = Ulcer R = Redness, W = Warmth, M = Maceration, PU = Pre-ulcerative lesion F = Fissure, S = Swelling, D =  Dryness Assessment Right: Left: Other Deformity: No No Prior Foot Ulcer: No No Prior Amputation: No No Charcot Joint: No No Ambulatory Status: Non-ambulatory Assistance Device: Wheelchair Gait: Surveyor, mining) Signed: 12/15/2023 9:06:19 AM By: Yevonne Pax RN Entered By: Yevonne Pax on 12/11/2023 09:08:15 -------------------------------------------------------------------------------- Nutrition Risk Screening Details Patient Name: Date of Service: Mitchell [name], [place] LD L. 12/11/2023 8:45 A M Medical Record Number: [id number] Patient Account Number: [id number] Date of Birth/Sex: Treating RN: 1971/11/18 (53 y.o. Mitchell [name]) Yevonne Pax Primary Care Debora Stockdale: Lindwood Qua Other Clinician: Referring Johnnathan Hagemeister: Treating Merilynn Haydu/Extender: [name] in Treatment: 0 Height (in): 59 Weight (lbs): 170 Body Mass Index (BMI): 34.3 Nutrition Risk Screening Items Score Screening NUTRITION RISK SCREEN: I have an illness or condition that made me change the kind and/or amount of food I eat 0 No I eat fewer than two meals per day 0 No I eat few fruits and vegetables, or milk products 0 No I have three or more drinks of beer, liquor or wine almost every day 0 No I have tooth or mouth problems that make it hard for me to eat 0 No Rosekrans, Sylvan L ([id number]) [id number] Page 5 of 5 I don't always have enough money to buy the food I need 0 No I eat alone most of the time 0 No I take three or more different prescribed or over-the-counter drugs a day 1 Yes Without wanting to, I have lost or gained 10 pounds in the last six months 0 No I am not always physically able to shop, cook and/or feed myself 2 Yes Nutrition Protocols Good Risk Protocol Moderate Risk Protocol 0 Provide education on nutrition High Risk Proctocol Risk Level: Moderate Risk Score: 3 Electronic Signature(s) Signed: 12/15/2023 9:06:19 AM By: Yevonne Pax RN Entered  By: Yevonne Pax on 12/11/2023 09:08:03

## 2023-12-15 NOTE — Progress Notes (Signed)
PAIDEN, SHORTRIDGE ([id number]) [id number].pdf Page 1 of 10 Visit Report for 12/11/2023 Chief Complaint Document Details Patient Name: Date of Service: [facility], [place] [name]. 12/11/2023 8:45 A M Medical Record Number: [id number] Patient Account Number: [id number] Date of Birth/Sex: Treating RN: 1971/02/09 (53 y.o. [name] Primary Care Provider: Lindwood Qua Other Clinician: Referring Provider: Treating Provider/Extender: [name] in Treatment: 0 Information Obtained from: Patient Chief Complaint Gluteal and sacral pressure ulcers Electronic Signature(s) Signed: 12/11/2023 9:35:13 AM By: Allen Derry PA-C Entered By: Allen Derry on 12/11/2023 09:35:13 -------------------------------------------------------------------------------- Debridement Details Patient Name: Date of Service: [facility], [name]. 12/11/2023 8:45 A M Medical Record Number: [id number] Patient Account Number: [id number] Date of Birth/Sex: Treating RN: [birth year] (52 y.o. [name] Primary Care Provider: Lindwood Qua Other Clinician: Referring Provider: Treating Provider/Extender: [name] in Treatment: 0 Debridement Performed for Assessment: Wound #2 Sacrum Performed By: Physician Allen Derry, PA-C The following information was scribed by: Yevonne Pax The information was scribed for: Allen Derry Debridement Type: Debridement Level of Consciousness (Pre-procedure): Awake and Alert Pre-procedure Verification/Time Out Yes - 09:50 Taken: Start Time: 09:50 Percent of Wound Bed Debrided: 100% T Area Debrided (cm): otal 2.75 Tissue and other material debrided: Viable, Non-Viable, Bone, Slough, Subcutaneous, Slough Level: Skin/Subcutaneous Tissue/Muscle/Bone Debridement Description: Excisional Instrument: Curette Specimen: Tissue Culture Number of Specimens T aken: 2 Bleeding: Moderate Giarrusso, Mitchell [name] ([id number])  [id number].pdf Page 2 of 10 Hemostasis Achieved: Pressure End Time: 09:53 Procedural Pain: 0 Post Procedural Pain: 0 Response to Treatment: Procedure was tolerated well Level of Consciousness (Post- Awake and Alert procedure): Post Debridement Measurements of Total Wound Length: (cm) 3.5 Stage: Category/Stage IV Width: (cm) 1 Depth: (cm) 3.5 Volume: (cm) 9.621 Character of Wound/Ulcer Post Debridement: Improved Post Procedure Diagnosis Same as Pre-procedure Electronic Signature(s) Signed: 12/11/2023 11:52:10 AM By: Yevonne Pax RN Signed: 12/11/2023 12:54:04 PM By: Allen Derry PA-C Entered By: Yevonne Pax on 12/11/2023 11:52:10 -------------------------------------------------------------------------------- HPI Details Patient Name: Date of Service: Mitchell [name], [name]. 12/11/2023 8:45 A M Medical Record Number: [id number] Patient Account Number: [id number] Date of Birth/Sex: Treating RN: 08-13-1971 (53 y.o. [name] Primary Care Provider: Lindwood Qua Other Clinician: Referring Provider: Treating Provider/Extender: Bethann Goo in Treatment: 0 History of Present Illness HPI Description: 12-11-2023 upon evaluation today patient presents for initial inspection here in our clinic concerning a unfortunately significant wound on the sacral area, left gluteal area, and right gluteal area which I think are all interrelated although they do not directly connect to be called just 1 wound. We therefore did put them in as separate wounds. Nonetheless the sacral wound is the one that seems to be exposing bone at this point. I do not see any signs of active infection at this time systemically though I am concerned about sacral osteomyelitis based on what I am seeing here currently. I discussed this with the patient today as well. Unfortunately this is a wound that has been present for quite some time although were not exactly sure of the exact  timing the patient is somewhat of a poor historian in that regard. 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 With that being said he cannot remember when the surgery actually was undertaken. Currently he is not on any antibiotics that I am aware of for the sacral osteomyelitis that I am  presuming is present based on what I am seeing. This is a good time to obtain a bone culture and we will get a see what the results of the culture and pathology say I did plan to do a pathology as well. Patient has a history of chronic kidney disease stage III, hypertension, and dwarfism. Electronic Signature(s) Signed: 12/11/2023 12:51:29 PM By: Allen Derry PA-C Entered By: Allen Derry on 12/11/2023 12:51:29 Oren Section ([id number]) [id number] Page 3 of 10 -------------------------------------------------------------------------------- Physical Exam Details Patient Name: Date of Service: Mitchell [name], [place] [name]. 12/11/2023 8:45 A M Medical Record Number: [id number] Patient Account Number: [id number] Date of Birth/Sex: Treating RN: 09-01-1971 (53 y.o. [name] Primary Care Provider: Lindwood Qua Other Clinician: Referring Provider: Treating Provider/Extender: [name] in Treatment: 0 Constitutional sitting or standing blood pressure is within target range for patient.. pulse regular and within target range for patient.Marland Kitchen respirations regular, non-labored and within target range for patient.Marland Kitchen temperature within target range for patient.. Obese and well-hydrated in no acute distress. Eyes conjunctiva clear no eyelid edema noted. pupils equal round and reactive to light and accommodation. Ears, Nose, Mouth, and Throat no gross abnormality of ear auricles or external auditory canals. normal hearing noted during conversation. mucus membranes  moist. Respiratory normal breathing without difficulty. Musculoskeletal normal gait and posture. no significant deformity or arthritic changes, no loss or range of motion, no clubbing. Psychiatric this patient is able to make decisions and demonstrates good insight into disease process. Alert and Oriented x 3. pleasant and cooperative. Notes Upon inspection patient's wound bed actually showed signs of necrotic tissue noted in the sacral area there is actually necrotic bone. I performed debridement primarily of the sacral region and clean away some of the necrotic tissue here I was able to actually obtain pathology samples of both bone for pathology as well as culture. I sent the culture to [facility] for PCR we should have this back hopefully fairly quickly. In regard to the pathology working to send this into [facility] and we will see what the results of that show my concern is osteomyelitis and determining what organism is present in the bone culture. Electronic Signature(s) Signed: 12/11/2023 12:52:12 PM By: Allen Derry PA-C Entered By: Allen Derry on 12/11/2023 12:52:11 -------------------------------------------------------------------------------- Physician Orders Details Patient Name: Date of Service: [facility], [place] [name]. 12/11/2023 8:45 A M Medical Record Number: [id number] Patient Account Number: [id number] Date of Birth/Sex: Treating RN: 1971/11/09 (52 y.o. [name] Primary Care Provider: Lindwood Qua Other Clinician: Referring Provider: Treating Provider/Extender: [name] in Treatment: 0 The following information was scribed by: Yevonne Pax The information was scribed for: Allen Derry Verbal / Phone Orders: No Mitchell [name], Mitchell [name] ([id number]) [id number] Page 4 of 10 Diagnosis Coding ICD-10 Coding Code Description L89.314 Pressure ulcer of right buttock, stage 4 L89.324 Pressure ulcer of left buttock, stage 4 L89.154  Pressure ulcer of sacral region, stage 4 N18.30 Chronic kidney disease, stage 3 unspecified I10 Essential (primary) hypertension Q77.4 Achondroplasia Follow-up Appointments Return Appointment in 1 week. Bathing/ Shower/ Hygiene Clean wound with Normal Saline or wound cleanser. Wound Treatment Wound #1 - Gluteus Cleanser: Normal Saline 1 x Per Day/30 Days Discharge Instructions: Wash your hands with soap and water. Remove old dressing, discard into plastic bag and place into trash. Cleanse the wound with Normal Saline prior to applying a clean dressing using gauze sponges, not tissues or cotton balls. Do not scrub or use  excessive force. Pat dry using gauze sponges, not tissue or cotton balls. Cleanser: Wound Cleanser 1 x Per Day/30 Days Discharge Instructions: Wash your hands with soap and water. Remove old dressing, discard into plastic bag and place into trash. Cleanse the wound with Wound Cleanser prior to applying a clean dressing using gauze sponges, not tissues or cotton balls. Do not scrub or use excessive force. Pat dry using gauze sponges, not tissue or cotton balls. Prim Dressing: Gauze 1 x Per Day/30 Days ary Discharge Instructions: moistened with Dakins 1/4 Solution Secondary Dressing: (BORDER) Zetuvit Plus SILICONE BORDER Dressing 4x4 (in/in) 1 x Per Day/30 Days Discharge Instructions: Please do not put silicone bordered dressings under wraps. Use non-bordered dressing only. Wound #2 - Sacrum Cleanser: Normal Saline 1 x Per Day/30 Days Discharge Instructions: Wash your hands with soap and water. Remove old dressing, discard into plastic bag and place into trash. Cleanse the wound with Normal Saline prior to applying a clean dressing using gauze sponges, not tissues or cotton balls. Do not scrub or use excessive force. Pat dry using gauze sponges, not tissue or cotton balls. Cleanser: Wound Cleanser 1 x Per Day/30 Days Discharge Instructions: Wash your hands with soap and  water. Remove old dressing, discard into plastic bag and place into trash. Cleanse the wound with Wound Cleanser prior to applying a clean dressing using gauze sponges, not tissues or cotton balls. Do not scrub or use excessive force. Pat dry using gauze sponges, not tissue or cotton balls. Prim Dressing: Gauze 1 x Per Day/30 Days ary Discharge Instructions: moistened with Dakins 1/4 Solution Secondary Dressing: (BORDER) Zetuvit Plus SILICONE BORDER Dressing 4x4 (in/in) 1 x Per Day/30 Days Discharge Instructions: Please do not put silicone bordered dressings under wraps. Use non-bordered dressing only. Wound #3 - Gluteus Wound Laterality: Left Cleanser: Normal Saline 1 x Per Day/30 Days Discharge Instructions: Wash your hands with soap and water. Remove old dressing, discard into plastic bag and place into trash. Cleanse the wound with Normal Saline prior to applying a clean dressing using gauze sponges, not tissues or cotton balls. Do not scrub or use excessive force. Pat dry using gauze sponges, not tissue or cotton balls. Cleanser: Wound Cleanser 1 x Per Day/30 Days Discharge Instructions: Wash your hands with soap and water. Remove old dressing, discard into plastic bag and place into trash. Cleanse the wound with Wound Cleanser prior to applying a clean dressing using gauze sponges, not tissues or cotton balls. Do not scrub or use excessive force. Pat dry using gauze sponges, not tissue or cotton balls. Prim Dressing: Gauze 1 x Per Day/30 Days ary Discharge Instructions: moistened with Dakins 1/4 Solution Secondary Dressing: (BORDER) Zetuvit Plus SILICONE BORDER Dressing 4x4 (in/in) 1 x Per Day/30 Days Discharge Instructions: Please do not put silicone bordered dressings under wraps. Use non-bordered dressing only. Laboratory Bacteria identified in Tissue by Biopsy culture (MICRO) - bone PCR - (ICD10 L89.154 - Pressure ulcer of sacral region, stage 4) LOINC Code: 16109-6 Convenience  Name: Biopsy specimen culture Mitchell [name], Mitchell [name] ([id number]) [id number] Page 5 of 10 Bacteria identified in Tissue by Biopsy culture (MICRO) - bone - (ICD10 L89.154 - Pressure ulcer of sacral region, stage 4) LOINC Code: 40102-7 Convenience Name: Biopsy specimen culture Electronic Signature(s) Signed: 12/11/2023 12:54:04 PM By: Allen Derry PA-C Signed: 12/15/2023 9:06:19 AM By: Yevonne Pax RN Entered By: Yevonne Pax on 12/11/2023 09:50:46 -------------------------------------------------------------------------------- Problem List Details Patient Name: Date of Service: Mitchell [name], [name]. 12/11/2023 8:45 A  M Medical Record Number: [id number] Patient Account Number: [id number] Date of Birth/Sex: Treating RN: 1971-04-09 (53 y.o. [name] Primary Care Provider: Lindwood Qua Other Clinician: Referring Provider: Treating Provider/Extender: [name] in Treatment: 0 Active Problems ICD-10 Encounter Code Description Active Date MDM Diagnosis L89.314 Pressure ulcer of right buttock, stage 4 12/11/2023 No Yes L89.324 Pressure ulcer of left buttock, stage 4 12/11/2023 No Yes L89.154 Pressure ulcer of sacral region, stage 4 12/11/2023 No Yes N18.30 Chronic kidney disease, stage 3 unspecified 12/11/2023 No Yes I10 Essential (primary) hypertension 12/11/2023 No Yes Q77.4 Achondroplasia 12/11/2023 No Yes Inactive Problems Resolved Problems Electronic Signature(s) Signed: 12/11/2023 9:34:56 AM By: Allen Derry PA-C Entered By: Allen Derry on 12/11/2023 09:34:56 Wismer, Mitchell [name] ([id number]) [id number] Page 6 of 10 -------------------------------------------------------------------------------- Progress Note Details Patient Name: Date of Service: Mitchell [name], [place] [name]. 12/11/2023 8:45 A M Medical Record Number: [id number] Patient Account Number: [id number] Date of Birth/Sex: Treating RN: 07/27/1971 (53 y.o. Judie Petit) Yevonne Pax Primary Care Provider: Lindwood Qua Other Clinician: Referring Provider: Treating Provider/Extender: [name] in Treatment: 0 Subjective Chief Complaint Information obtained from Patient Gluteal and sacral pressure ulcers History of Present Illness (HPI) 12-11-2023 upon evaluation today patient presents for initial inspection here in our clinic concerning a unfortunately significant wound on the sacral area, left gluteal area, and right gluteal area which I think are all interrelated although they do not directly connect to be called just 1 wound. We therefore did put them in as separate wounds. Nonetheless the sacral wound is the one that seems to be exposing bone at this point. I do not see any signs of active infection at this time systemically though I am concerned about sacral osteomyelitis based on what I am seeing here currently. I discussed this with the patient today as well. Unfortunately this is a wound that has been present for quite some time although were not exactly sure of the exact timing the patient is somewhat of a poor historian in that regard. 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 With that being said he cannot remember when the surgery actually was undertaken. Currently he is not on any antibiotics that I am aware of for the sacral osteomyelitis that I am presuming is present based on what I am seeing. This is a good time to obtain a bone culture and we will get a see what the results of the culture and pathology say I did plan to do a pathology as well. Patient has a history of chronic kidney disease stage III, hypertension, and dwarfism. Patient History Allergies shrimp, shellfish derived Social History Never smoker, Marital Status - Single, Alcohol Use - Never, Drug Use - No History, Caffeine Use - Daily. Medical  History Cardiovascular Patient has history of Hypertension Review of Systems (ROS) Integumentary (Skin) Complains or has symptoms of Wounds. Neurologic Complains or has symptoms of Numbness/parasthesias - lower legs. Objective Constitutional sitting or standing blood pressure is within target range for patient.. pulse regular and within target range for patient.Marland Kitchen respirations regular, non-labored and within target range for patient.Marland Kitchen temperature within target range for patient.. Obese and well-hydrated in no acute distress. Vitals Time Taken: 9:02 AM, Height: 59 in, Source: Stated, Weight: 170 lbs, Source: Stated, BMI: 34.3, Temperature: 98.3 F, Pulse: 76 bpm, Respiratory Rate: 18 breaths/min, Blood Pressure: 131/76 mmHg. Eyes conjunctiva clear no eyelid edema  noted. pupils equal round and reactive to light and accommodation. Ears, Nose, Mouth, and Throat no gross abnormality of ear auricles or external auditory canals. normal hearing noted during conversation. mucus membranes moist. Searles, Mitchell [name] ([id number]) [id number].pdf Page 7 of 10 Respiratory normal breathing without difficulty. Musculoskeletal normal gait and posture. no significant deformity or arthritic changes, no loss or range of motion, no clubbing. Psychiatric this patient is able to make decisions and demonstrates good insight into disease process. Alert and Oriented x 3. pleasant and cooperative. General Notes: Upon inspection patient's wound bed actually showed signs of necrotic tissue noted in the sacral area there is actually necrotic bone. I performed debridement primarily of the sacral region and clean away some of the necrotic tissue here I was able to actually obtain pathology samples of both bone for pathology as well as culture. I sent the culture to [facility] for PCR we should have this back hopefully fairly quickly. In regard to the pathology working to send this into [facility] and we will  see what the results of that show my concern is osteomyelitis and determining what organism is present in the bone culture. Integumentary (Hair, Skin) Wound #1 status is Open. Original cause of wound was Gradually Appeared. The date acquired was: 11/24/2022. The wound is located on the Gluteus. The wound measures 4cm length x 3cm width x 2cm depth; 9.425cm^2 area and 18.85cm^3 volume. There is muscle, tendon, and Fat Layer (Subcutaneous Tissue) exposed. There is no tunneling or undermining noted. There is a medium amount of serosanguineous drainage noted. There is large (67-100%) red granulation within the wound bed. There is a small (1-33%) amount of necrotic tissue within the wound bed including Adherent Slough. Wound #2 status is Open. Original cause of wound was Pressure Injury. The date acquired was: 11/24/2022. The wound is located on the Sacrum. The wound measures 3.5cm length x 1cm width x 3.5cm depth; 2.749cm^2 area and 9.621cm^3 volume. There is bone, muscle, tendon, and Fat Layer (Subcutaneous Tissue) exposed. There is no tunneling or undermining noted. There is a medium amount of serosanguineous drainage noted. There is large (67-100%) red granulation within the wound bed. There is a small (1-33%) amount of necrotic tissue within the wound bed including Adherent Slough. Wound #3 status is Open. Original cause of wound was Gradually Appeared. The date acquired was: 11/24/2022. The wound is located on the Left Gluteus. The wound measures 3cm length x 2.7cm width x 3.8cm depth; 6.362cm^2 area and 24.175cm^3 volume. There is muscle, tendon, and Fat Layer (Subcutaneous Tissue) exposed. There is no tunneling or undermining noted. There is a medium amount of serosanguineous drainage noted. There is large (67-100%) red granulation within the wound bed. There is a small (1-33%) amount of necrotic tissue within the wound bed including Adherent Slough. Assessment Active Problems ICD-10 Pressure ulcer of  right buttock, stage 4 Pressure ulcer of left buttock, stage 4 Pressure ulcer of sacral region, stage 4 Chronic kidney disease, stage 3 unspecified Essential (primary) hypertension Achondroplasia Procedures Wound #2 Pre-procedure diagnosis of Wound #2 is a Pressure Ulcer located on the Sacrum . There was a Excisional Skin/Subcutaneous Tissue/Muscle/Bone Debridement with a total area of 2.75 sq cm performed by Allen Derry, PA-C. With the following instrument(s): Curette to remove Viable and Non-Viable tissue/material. Material removed includes Bone,Subcutaneous Tissue, and Slough. 2 specimens were taken by a Tissue Culture and sent to the lab per facility protocol. A time out was conducted at 09:50, prior to the start of the  procedure. A Moderate amount of bleeding was controlled with Pressure. The procedure was tolerated well with a pain level of 0 throughout and a pain level of 0 following the procedure. Post Debridement Measurements: 3.5cm length x 1cm width x 3.5cm depth; 9.621cm^3 volume. Post debridement Stage noted as Category/Stage IV. Character of Wound/Ulcer Post Debridement is improved. Post procedure Diagnosis Wound #2: Same as Pre-Procedure Plan Follow-up Appointments: Return Appointment in 1 week. Bathing/ Shower/ Hygiene: Clean wound with Normal Saline or wound cleanser. Laboratory ordered were: Biopsy specimen culture - bone PCR, Biopsy specimen culture - bone WOUND #1: - Gluteus Wound Laterality: Cleanser: Normal Saline 1 x Per Day/30 Days Discharge Instructions: Wash your hands with soap and water. Remove old dressing, discard into plastic bag and place into trash. Cleanse the wound with Normal Saline prior to applying a clean dressing using gauze sponges, not tissues or cotton balls. Do not scrub or use excessive force. Pat dry using gauze sponges, not tissue or cotton balls. Cleanser: Wound Cleanser 1 x Per Day/30 Days Discharge Instructions: Wash your hands with soap  and water. Remove old dressing, discard into plastic bag and place into trash. Cleanse the wound with Wound Cleanser prior to applying a clean dressing using gauze sponges, not tissues or cotton balls. Do not scrub or use excessive force. Pat dry using gauze sponges, not tissue or cotton balls. Prim Dressing: Gauze 1 x Per Day/30 Days ary Discharge Instructions: moistened with Dakins 1/4 Solution Secondary Dressing: (BORDER) Zetuvit Plus SILICONE BORDER Dressing 4x4 (in/in) 1 x Per Day/30 Days Caldron, Mitchell [name] ([id number]) [id number] Page 8 of 10 Discharge Instructions: Please do not put silicone bordered dressings under wraps. Use non-bordered dressing only. WOUND #2: - Sacrum Wound Laterality: Cleanser: Normal Saline 1 x Per Day/30 Days Discharge Instructions: Wash your hands with soap and water. Remove old dressing, discard into plastic bag and place into trash. Cleanse the wound with Normal Saline prior to applying a clean dressing using gauze sponges, not tissues or cotton balls. Do not scrub or use excessive force. Pat dry using gauze sponges, not tissue or cotton balls. Cleanser: Wound Cleanser 1 x Per Day/30 Days Discharge Instructions: Wash your hands with soap and water. Remove old dressing, discard into plastic bag and place into trash. Cleanse the wound with Wound Cleanser prior to applying a clean dressing using gauze sponges, not tissues or cotton balls. Do not scrub or use excessive force. Pat dry using gauze sponges, not tissue or cotton balls. Prim Dressing: Gauze 1 x Per Day/30 Days ary Discharge Instructions: moistened with Dakins 1/4 Solution Secondary Dressing: (BORDER) Zetuvit Plus SILICONE BORDER Dressing 4x4 (in/in) 1 x Per Day/30 Days Discharge Instructions: Please do not put silicone bordered dressings under wraps. Use non-bordered dressing only. WOUND #3: - Gluteus Wound Laterality: Left Cleanser: Normal Saline 1 x Per Day/30  Days Discharge Instructions: Wash your hands with soap and water. Remove old dressing, discard into plastic bag and place into trash. Cleanse the wound with Normal Saline prior to applying a clean dressing using gauze sponges, not tissues or cotton balls. Do not scrub or use excessive force. Pat dry using gauze sponges, not tissue or cotton balls. Cleanser: Wound Cleanser 1 x Per Day/30 Days Discharge Instructions: Wash your hands with soap and water. Remove old dressing, discard into plastic bag and place into trash. Cleanse the wound with Wound Cleanser prior to applying a clean dressing using gauze sponges, not tissues or cotton balls. Do not scrub or use excessive  force. Pat dry using gauze sponges, not tissue or cotton balls. Prim Dressing: Gauze 1 x Per Day/30 Days ary Discharge Instructions: moistened with Dakins 1/4 Solution Secondary Dressing: (BORDER) Zetuvit Plus SILICONE BORDER Dressing 4x4 (in/in) 1 x Per Day/30 Days Discharge Instructions: Please do not put silicone bordered dressings under wraps. Use non-bordered dressing only. 1. Based on what I am seeing I am going to recommend that we do not put the patient on any antibiotics yet although I think he is likely get end up with antibiotic therapy and we will see the results of the pathology and bone culture before making a decision there. 2. I am good recommend as well should continue with that the recommendations for Dakin's moistened gauze dressings to each of the wound areas I think this is the best way to go in that regard. 3. I am also can recommend continued appropriate offloading when I discussed with the patient offloading and how he needed to turn from 1 side to the other change of position every 2 hours he told me "if I do that I will be able to see the TV". With that being said I did inquire as to whether or not the TV could be repositioned but apparently this is not something that can be done. I am not sure if he could  be moved to a different room though be more conducive to his healing but that is something that were going to have the facility look into as well. He needs to be offloading every 2 hours keep pressure off of the sacral gluteal area because I can see definitive issues with significant pressure at this point. We will see patient back for reevaluation in 1 week here in the clinic. If anything worsens or changes patient will contact our office for additional recommendations. Electronic Signature(s) Signed: 12/11/2023 12:53:25 PM By: Allen Derry PA-C Entered By: Allen Derry on 12/11/2023 12:53:25 -------------------------------------------------------------------------------- ROS/PFSH Details Patient Name: Date of Service: [facility], RO [name]. 12/11/2023 8:45 A M Medical Record Number: [id number] Patient Account Number: [id number] Date of Birth/Sex: Treating RN: October 03, 1971 (53 y.o. [name] Primary Care Provider: Lindwood Qua Other Clinician: Referring Provider: Treating Provider/Extender: [name] in Treatment: 0 Integumentary (Skin) Complaints and Symptoms: Positive for: Wounds Neurologic Complaints and Symptoms: Positive for: Numbness/parasthesias - lower legs Cardiovascular Kollmann, Mitchell [name] ([id number]) [id number] Page 9 of 10 Medical History: Positive for: Hypertension Immunizations Implantable Devices No devices added Family and Social History Never smoker; Marital Status - Single; Alcohol Use: Never; Drug Use: No History; Caffeine Use: Daily Social Determinants of Health (SDOH) 1. In the past 2 months, did you or others you live with eat smaller meals or skip meals because you didn't have money for foodo : No 2. Are you homeless or worried that you might be in the futureo : No 3. Do you have trouble paying for your utilities (gas, electricity, phone)o : No 4. Do you have trouble finding or paying for a rideo : No 5. Do  you need daycare, or better daycare, for your kidso : No 6. Are you unemployed or without regular incomeo : No 7. Do you need help finding a better jobo : No 8. Do you need help getting more educationo : No 9. Are you concerned about someone in your home using drugs or alcoholo : No 10. Do you feel unsafe in your daily lifeo : No 11. Is anyone in your home threatening or abusing youo :  No 12. Do you lack quality relationships that make you feel valued and supportedo : No 13. Do you need help getting cultural information in a language you understando : No 14. Do you need help getting internet accesso : No Advanced Directives and Instructions Spiritual or Cultural beliefs preclude asking about Advance Care Planning: No Advanced Directives: No Patient wants information on Advanced Directives: No Do not resuscitate: No Living Will: No Medical Power of Attorney: No Surrogate Decision Maker: No Electronic Signature(s) Signed: 12/11/2023 12:54:04 PM By: Allen Derry PA-C Signed: 12/15/2023 9:06:19 AM By: Yevonne Pax RN Entered By: Yevonne Pax on 12/11/2023 09:06:18 -------------------------------------------------------------------------------- SuperBill Details Patient Name: Date of Service: Mitchell [name], RO [name]. 12/11/2023 Medical Record Number: [id number] Patient Account Number: [id number] Date of Birth/Sex: Treating RN: 11-Apr-1971 (52 y.o. [name] Primary Care Provider: Lindwood Qua Other Clinician: Referring Provider: Treating Provider/Extender: [name] in Treatment: 0 Diagnosis Coding ICD-10 Codes Code Description L89.314 Pressure ulcer of right buttock, stage 4 L89.324 Pressure ulcer of left buttock, stage 4 L89.154 Pressure ulcer of sacral region, stage 4 N18.30 Chronic kidney disease, stage 3 unspecified I10 Essential (primary) hypertension Kane, Jcion [name] ([id number]) [id number] Page 10 of 10 Q77.4  Achondroplasia Facility Procedures : CPT4 Code: [id number] Description: 99214 - WOUND CARE VISIT-LEV 4 EST PT Modifier: Quantity: 1 : CPT4 Code: 60109323 Description: 11044 - DEB BONE 20 SQ CM/< ICD-10 Diagnosis Description L89.154 Pressure ulcer of sacral region, stage 4 Modifier: Quantity: 1 Physician Procedures : CPT4: Description Modifier Code [id number] 25427 - WC PHYS LEVEL 4 - NEW PT 25 ICD-10 Diagnosis Description L89.314 Pressure ulcer of right buttock, stage 4 L89.324 Pressure ulcer of left buttock, stage 4 L89.154 Pressure ulcer of sacral region, stage 4  N18.30 Chronic kidney disease, stage 3 unspecified Quantity: 1 : CPT4: [id number] Debridement; bone (includes epidermis, dermis, subQ tissue, muscle and/or fascia, if performed) 1st 20 sqcm or less ICD-10 Diagnosis Description L89.154 Pressure ulcer of sacral region, stage 4 Quantity: 1 Electronic Signature(s) Signed: 12/11/2023 12:53:50 PM By: Allen Derry PA-C Entered By: Allen Derry on 12/11/2023 12:53:49

## 2023-12-15 NOTE — Progress Notes (Signed)
Mitchell [name], Mitchell [name] ([id number]) [id number] Page 1 of 12 Visit Report for 12/11/2023 Allergy List Details Patient Name: Date of Service: [facility], [place] [name]. 12/11/2023 8:45 A M Medical Record Number: [id number] Patient Account Number: [id number] Date of Birth/Sex: Treating RN: [birth year] (53 y.o. [name] Primary Care Nathalya Wolanski: Lindwood Qua Other Clinician: Referring Shann Merrick: Treating Bretta Fees/Extender: [name] in Treatment: 0 Allergies Active Allergies shrimp shellfish derived Allergy Notes Electronic Signature(s) Signed: 12/15/2023 9:06:19 AM By: Yevonne Pax RN Entered By: Yevonne Pax on 12/11/2023 09:03:42 -------------------------------------------------------------------------------- Arrival Information Details Patient Name: Date of Service: Mitchell [name], RO [name]. 12/11/2023 8:45 A M Medical Record Number: [id number] Patient Account Number: [id number] Date of Birth/Sex: Treating RN: Jan 24, 1971 (52 y.o. [name] Primary Care Cariah Salatino: Lindwood Qua Other Clinician: Referring Yoshimi Sarr: Treating Jayvien Rowlette/Extender: Bethann Goo in Treatment: 0 Visit Information Patient Arrived: Wheel Chair Arrival Time: 09:01 Accompanied By: driver Transfer Assistance: Michiel Sites Lift Patient Identification Verified: Yes Secondary Verification Process Completed: Yes Patient Requires Transmission-Based Precautions: No Patient Has Alerts: No Electronic Signature(s) Signed: 12/15/2023 9:06:19 AM By: Yevonne Pax RN Mitchell [name], Mitchell [name] ([id number]) [id number] Page 2 of 12 Entered By: Yevonne Pax on 12/11/2023 09:01:42 -------------------------------------------------------------------------------- Clinic Level of Care Assessment Details Patient Name: Date of Service: [facility], [place] [name]. 12/11/2023 8:45 A M Medical Record Number: [id number] Patient Account Number: [id number] Date of Birth/Sex:  Treating RN: 1970/12/08 (53 y.o. [name] Primary Care Marolyn Urschel: Lindwood Qua Other Clinician: Referring Peter Daquila: Treating Isel Skufca/Extender: [name] in Treatment: 0 Clinic Level of Care Assessment Items TOOL 2 Quantity Score X- 1 0 Use when only an EandM is performed on the INITIAL visit ASSESSMENTS - Nursing Assessment / Reassessment X- 1 20 General Physical Exam (combine w/ comprehensive assessment (listed just below) when performed on new pt. evals) X- 1 25 Comprehensive Assessment (HX, ROS, Risk Assessments, Wounds Hx, etc.) ASSESSMENTS - Wound and Skin A ssessment / Reassessment []  - 0 Simple Wound Assessment / Reassessment - one wound X- 3 5 Complex Wound Assessment / Reassessment - multiple wounds []  - 0 Dermatologic / Skin Assessment (not related to wound area) ASSESSMENTS - Ostomy and/or Continence Assessment and Care []  - 0 Incontinence Assessment and Management []  - 0 Ostomy Care Assessment and Management (repouching, etc.) PROCESS - Coordination of Care X - Simple Patient / Family Education for ongoing care 1 15 []  - 0 Complex (extensive) Patient / Family Education for ongoing care []  - 0 Staff obtains Chiropractor, Records, T Results / Process Orders est []  - 0 Staff telephones HHA, Nursing Homes / Clarify orders / etc []  - 0 Routine Transfer to another Facility (non-emergent condition) []  - 0 Routine Hospital Admission (non-emergent condition) []  - 0 New Admissions / Manufacturing engineer / Ordering NPWT Apligraf, etc. , []  - 0 Emergency Hospital Admission (emergent condition) X- 1 10 Simple Discharge Coordination []  - 0 Complex (extensive) Discharge Coordination PROCESS - Special Needs []  - 0 Pediatric / Minor Patient Management []  - 0 Isolation Patient Management []  - 0 Hearing / Language / Visual special needs []  - 0 Assessment of Community assistance (transportation, D/C planning, etc.) []  - 0 Additional  assistance / Altered mentation []  - 0 Support Surface(s) Assessment (bed, cushion, seat, etc.) Mitchell [name], Mitchell [name] ([id number]) [id number] Page 3 of 12 INTERVENTIONS - Wound Cleansing / Measurement X- 1 5 Wound Imaging (photographs - any number of wounds) []  - 0 Wound Tracing (instead of  photographs) []  - 0 Simple Wound Measurement - one wound X- 3 5 Complex Wound Measurement - multiple wounds []  - 0 Simple Wound Cleansing - one wound X- 3 5 Complex Wound Cleansing - multiple wounds INTERVENTIONS - Wound Dressings X - Small Wound Dressing one or multiple wounds 3 10 []  - 0 Medium Wound Dressing one or multiple wounds []  - 0 Large Wound Dressing one or multiple wounds []  - 0 Application of Medications - injection INTERVENTIONS - Miscellaneous []  - 0 External ear exam []  - 0 Specimen Collection (cultures, biopsies, blood, body fluids, etc.) []  - 0 Specimen(s) / Culture(s) sent or taken to Lab for analysis []  - 0 Patient Transfer (multiple staff / Nurse, adult / Similar devices) []  - 0 Simple Staple / Suture removal (25 or less) []  - 0 Complex Staple / Suture removal (26 or more) []  - 0 Hypo / Hyperglycemic Management (close monitor of Blood Glucose) []  - 0 Ankle / Brachial Index (ABI) - do not check if billed separately Has the patient been seen at the hospital within the last three years: Yes Total Score: 150 Level Of Care: New/Established - Level 4 Electronic Signature(s) Signed: 12/15/2023 9:06:19 AM By: Yevonne Pax RN Entered By: Yevonne Pax on 12/11/2023 09:43:10 -------------------------------------------------------------------------------- Encounter Discharge Information Details Patient Name: Date of Service: Mitchell [name], RO [name]. 12/11/2023 8:45 A M Medical Record Number: [id number] Patient Account Number: [id number] Date of Birth/Sex: Treating RN: 10-10-1971 (53 y.o. [name] Primary Care Dearra Myhand: Lindwood Qua Other  Clinician: Referring Shaida Route: Treating Jeromy Borcherding/Extender: Bethann Goo in Treatment: 0 Encounter Discharge Information Items Discharge Condition: Stable Ambulatory Status: Wheelchair Discharge Destination: Home Transportation: Private Auto Accompanied By: driver Schedule Follow-up Appointment: Yes Clinical Summary of Care: Mitchell [name], Mitchell [name] ([id number]) [id number] Page 4 of 12 Electronic Signature(s) Signed: 12/15/2023 9:06:19 AM By: Yevonne Pax RN Entered By: Yevonne Pax on 12/11/2023 09:43:56 -------------------------------------------------------------------------------- Lower Extremity Assessment Details Patient Name: Date of Service: [facility], [place] [name]. 12/11/2023 8:45 A M Medical Record Number: [id number] Patient Account Number: [id number] Date of Birth/Sex: Treating RN: Jul 04, 1971 (53 y.o. [name] Primary Care Stacee Earp: Lindwood Qua Other Clinician: Referring Fitzpatrick Alberico: Treating Tashawn Greff/Extender: [name] in Treatment: 0 Electronic Signature(s) Signed: 12/15/2023 9:06:19 AM By: Yevonne Pax RN Entered By: Yevonne Pax on 12/11/2023 09:08:31 -------------------------------------------------------------------------------- Multi Wound Chart Details Patient Name: Date of Service: Mitchell [name], RO [name]. 12/11/2023 8:45 A M Medical Record Number: [id number] Patient Account Number: [id number] Date of Birth/Sex: Treating RN: July 30, 1971 (52 y.o. [name] Primary Care Jamair Cato: Lindwood Qua Other Clinician: Referring Rileyann Florance: Treating Zolton Dowson/Extender: [name] in Treatment: 0 Vital Signs Height(in): 59 Pulse(bpm): 76 Weight(lbs): 170 Blood Pressure(mmHg): 131/76 Body Mass Index(BMI): 34.3 Temperature(F): 98.3 Respiratory Rate(breaths/min): 18 [1:Photos:] Gluteus Sacrum Left Gluteus Wound Location: Gradually Appeared Pressure Injury Gradually Appeared Wounding  Event: Mitchell [name], Mitchell [name] ([id number]) [id number] Page 5 of 12 Pressure Ulcer Pressure Ulcer Pressure Ulcer Primary Etiology: Hypertension Hypertension Hypertension Comorbid History: 11/24/2022 11/24/2022 11/24/2022 Date Acquired: 0 0 0 Weeks of Treatment: Open Open Open Wound Status: No No No Wound Recurrence: 4x3x2 3.5x1x3.5 3x2.7x3.8 Measurements [name] x W x D (cm) 9.425 2.749 6.362 A (cm) : rea 18.85 9.621 24.175 Volume (cm) : Category/Stage IV Category/Stage IV Category/Stage IV Classification: Medium Medium Medium Exudate A mount: Serosanguineous Serosanguineous Serosanguineous Exudate Type: red, brown red, brown red, brown Exudate Color: Large (67-100%) Large (67-100%) Large (67-100%) Granulation A mount: Red Red  Red Granulation Quality: Small (1-33%) Small (1-33%) Small (1-33%) Necrotic A mount: Fat Layer (Subcutaneous Tissue): Yes Fat Layer (Subcutaneous Tissue): Yes Fat Layer (Subcutaneous Tissue): Yes Exposed Structures: Tendon: Yes Tendon: Yes Tendon: Yes Muscle: Yes Muscle: Yes Muscle: Yes Fascia: No Bone: Yes Fascia: No Joint: No Fascia: No Joint: No Bone: No Joint: No Bone: No None None None Epithelialization: Treatment Notes Electronic Signature(s) Signed: 12/11/2023 9:21:25 AM By: Yevonne Pax RN Entered By: Yevonne Pax on 12/11/2023 09:21:25 -------------------------------------------------------------------------------- Multi-Disciplinary Care Plan Details Patient Name: Date of Service: Mitchell [name], RO [name]. 12/11/2023 8:45 A M Medical Record Number: [id number] Patient Account Number: [id number] Date of Birth/Sex: Treating RN: [birth year] (53 y.o. [name] Primary Care Xzaria Teo: Lindwood Qua Other Clinician: Referring Li Fragoso: Treating Aashna Matson/Extender: [name] in Treatment: 0 Active Inactive Necrotic Tissue Nursing Diagnoses: Knowledge deficit related to management of  necrotic/devitalized tissue Goals: Necrotic/devitalized tissue will be minimized in the wound bed Date Initiated: 12/11/2023 Target Resolution Date: 01/11/2024 Goal Status: Active Interventions: Assess patient pain level pre-, during and post procedure and prior to discharge Notes: Nutrition Nursing Diagnoses: Potential for alteration in Nutrition/Potential for imbalanced nutrition Goals: ALASTER, GASQUE ([id number]) [id number] Page 6 of 12 Patient/caregiver agrees to and verbalizes understanding of need to use nutritional supplements and/or vitamins as prescribed Date Initiated: 12/11/2023 Target Resolution Date: 01/11/2024 Goal Status: Active Interventions: Assess patient nutrition upon admission and as needed per policy Notes: Wound/Skin Impairment Nursing Diagnoses: Knowledge deficit related to ulceration/compromised skin integrity Goals: Patient/caregiver will verbalize understanding of skin care regimen Date Initiated: 12/11/2023 Target Resolution Date: 01/11/2024 Goal Status: Active Ulcer/skin breakdown will have a volume reduction of 30% by week 4 Date Initiated: 12/11/2023 Target Resolution Date: 01/11/2024 Goal Status: Active Ulcer/skin breakdown will have a volume reduction of 50% by week 8 Date Initiated: 12/11/2023 Target Resolution Date: 02/08/2024 Goal Status: Active Ulcer/skin breakdown will have a volume reduction of 80% by week 12 Date Initiated: 12/11/2023 Target Resolution Date: 03/10/2024 Goal Status: Active Ulcer/skin breakdown will heal within 14 weeks Date Initiated: 12/11/2023 Target Resolution Date: 04/09/2024 Goal Status: Active Interventions: Assess patient/caregiver ability to obtain necessary supplies Assess patient/caregiver ability to perform ulcer/skin care regimen upon admission and as needed Assess ulceration(s) every visit Notes: Electronic Signature(s) Signed: 12/11/2023 9:23:01 AM By: Yevonne Pax RN Entered By:  Yevonne Pax on 12/11/2023 09:23:01 -------------------------------------------------------------------------------- Pain Assessment Details Patient Name: Date of Service: Mitchell [name], RO [name]. 12/11/2023 8:45 A M Medical Record Number: [id number] Patient Account Number: [id number] Date of Birth/Sex: Treating RN: [birth year] (53 y.o. [name] Primary Care Junelle Hashemi: Lindwood Qua Other Clinician: Referring Aikam Hellickson: Treating Bertram Haddix/Extender: [name] in Treatment: 0 Active Problems Location of Pain Severity and Description of Pain Patient Has Paino No Site Locations MEADE, EDD ([id number]) [id number] Page 7 of 12 Pain Management and Medication Current Pain Management: Electronic Signature(s) Signed: 12/15/2023 9:06:19 AM By: Yevonne Pax RN Entered By: Yevonne Pax on 12/11/2023 09:02:23 -------------------------------------------------------------------------------- Patient/Caregiver Education Details Patient Name: Date of Service: Mitchell [name], RO [name]. 1/17/2025andnbsp8:45 A M Medical Record Number: [id number] Patient Account Number: [id number] Date of Birth/Gender: Treating RN: [birth year] (53 y.o. [name] Primary Care Physician: Lindwood Qua Other Clinician: Referring Physician: Treating Physician/Extender: Bethann Goo in Treatment: 0 Education Assessment Education Provided To: Patient and Caregiver Education Topics Provided Wound/Skin Impairment: Handouts: Caring for Your Ulcer Methods: Explain/Verbal, Printed Responses: State content correctly Electronic Signature(s) Signed: 12/15/2023 9:06:19 AM By:  Yevonne Pax RN Entered By: Yevonne Pax on 12/11/2023 09:23:22 Oren Section ([id number]) [id number] Page 8 of 12 -------------------------------------------------------------------------------- Wound Assessment Details Patient Name: Date of Service: [facility],  [place] [name]. 12/11/2023 8:45 A M Medical Record Number: [id number] Patient Account Number: [id number] Date of Birth/Sex: Treating RN: 1971-02-10 (53 y.o. Judie Petit) Yevonne Pax Primary Care Dreydon Cardenas: Lindwood Qua Other Clinician: Referring Luis Nickles: Treating Charlese Gruetzmacher/Extender: [name] in Treatment: 0 Wound Status Wound Number: 1 Primary Etiology: Pressure Ulcer Wound Location: Gluteus Wound Status: Open Wounding Event: Gradually Appeared Comorbid History: Hypertension Date Acquired: 11/24/2022 Weeks Of Treatment: 0 Clustered Wound: No Photos Wound Measurements Length: (cm) 4 Width: (cm) 3 Depth: (cm) 2 Area: (cm) 9.425 Volume: (cm) 18.85 % Reduction in Area: % Reduction in Volume: Epithelialization: None Tunneling: No Undermining: No Wound Description Classification: Category/Stage IV Exudate Amount: Medium Exudate Type: Serosanguineous Exudate Color: red, brown Foul Odor After Cleansing: No Slough/Fibrino Yes Wound Bed Granulation Amount: Large (67-100%) Exposed Structure Granulation Quality: Red Fascia Exposed: No Necrotic Amount: Small (1-33%) Fat Layer (Subcutaneous Tissue) Exposed: Yes Necrotic Quality: Adherent Slough Tendon Exposed: Yes Muscle Exposed: Yes Necrosis of Muscle: No Joint Exposed: No Bone Exposed: No Treatment Notes Wound #1 (Gluteus) Cleanser Normal Saline Discharge Instruction: Wash your hands with soap and water. Remove old dressing, discard into plastic bag and place into trash. Cleanse the wound with Normal Saline prior to applying a clean dressing using gauze sponges, not tissues or cotton balls. Do not scrub or use excessive Mitchell [name], Mitchell [name] ([id number]) [id number] Page 9 of 12 force. Pat dry using gauze sponges, not tissue or cotton balls. Wound Cleanser Discharge Instruction: Wash your hands with soap and water. Remove old dressing, discard into plastic bag and place into trash. Cleanse  the wound with Wound Cleanser prior to applying a clean dressing using gauze sponges, not tissues or cotton balls. Do not scrub or use excessive force. Pat dry using gauze sponges, not tissue or cotton balls. Peri-Wound Care Topical Primary Dressing Gauze Discharge Instruction: moistened with Dakins 1/4 Solution Secondary Dressing (BORDER) Zetuvit Plus SILICONE BORDER Dressing 4x4 (in/in) Discharge Instruction: Please do not put silicone bordered dressings under wraps. Use non-bordered dressing only. Secured With Compression Wrap Compression Stockings Facilities manager) Signed: 12/11/2023 9:18:00 AM By: Yevonne Pax RN Entered By: Yevonne Pax on 12/11/2023 09:18:00 -------------------------------------------------------------------------------- Wound Assessment Details Patient Name: Date of Service: Mitchell [name], RO [name]. 12/11/2023 8:45 A M Medical Record Number: [id number] Patient Account Number: [id number] Date of Birth/Sex: Treating RN: 12-14-1970 (52 y.o. [name] Primary Care Hodaya Curto: Lindwood Qua Other Clinician: Referring Jaecion Dempster: Treating Remie Mathison/Extender: [name] in Treatment: 0 Wound Status Wound Number: 2 Primary Etiology: Pressure Ulcer Wound Location: Sacrum Wound Status: Open Wounding Event: Pressure Injury Comorbid History: Hypertension Date Acquired: 11/24/2022 Weeks Of Treatment: 0 Clustered Wound: No Photos Wound Measurements Mitchell [name], Mitchell [name] ([id number]) Length: (cm) 3.5 Width: (cm) 1 Depth: (cm) 3.5 Area: (cm) 2.749 Volume: (cm) 9.621 [id number] Page 10 of 12 % Reduction in Area: % Reduction in Volume: Epithelialization: None Tunneling: No Undermining: No Wound Description Classification: Category/Stage IV Exudate Amount: Medium Exudate Type: Serosanguineous Exudate Color: red, brown Foul Odor After Cleansing: No Slough/Fibrino Yes Wound Bed Granulation Amount: Large  (67-100%) Exposed Structure Granulation Quality: Red Fascia Exposed: No Necrotic Amount: Small (1-33%) Fat Layer (Subcutaneous Tissue) Exposed: Yes Necrotic Quality: Adherent Slough Tendon Exposed: Yes Muscle Exposed: Yes Necrosis of Muscle: No Joint Exposed: No Bone Exposed: Yes  Treatment Notes Wound #2 (Sacrum) Cleanser Normal Saline Discharge Instruction: Wash your hands with soap and water. Remove old dressing, discard into plastic bag and place into trash. Cleanse the wound with Normal Saline prior to applying a clean dressing using gauze sponges, not tissues or cotton balls. Do not scrub or use excessive force. Pat dry using gauze sponges, not tissue or cotton balls. Wound Cleanser Discharge Instruction: Wash your hands with soap and water. Remove old dressing, discard into plastic bag and place into trash. Cleanse the wound with Wound Cleanser prior to applying a clean dressing using gauze sponges, not tissues or cotton balls. Do not scrub or use excessive force. Pat dry using gauze sponges, not tissue or cotton balls. Peri-Wound Care Topical Primary Dressing Gauze Discharge Instruction: moistened with Dakins 1/4 Solution Secondary Dressing (BORDER) Zetuvit Plus SILICONE BORDER Dressing 4x4 (in/in) Discharge Instruction: Please do not put silicone bordered dressings under wraps. Use non-bordered dressing only. Secured With Compression Wrap Compression Stockings Facilities manager) Signed: 12/11/2023 9:19:30 AM By: Yevonne Pax RN Entered By: Yevonne Pax on 12/11/2023 09:19:30 -------------------------------------------------------------------------------- Wound Assessment Details Patient Name: Date of Service: [facility], [name]. 12/11/2023 8:45 A Mitchell [name], Mitchell [name] ([id number]) [id number] Page 11 of 12 Medical Record Number: [id number] Patient Account Number: [id number] Date of Birth/Sex: Treating RN: 1970-12-29 (53 y.o. Judie Petit) Yevonne Pax Primary Care Serene Kopf: Lindwood Qua Other Clinician: Referring Priyal Musquiz: Treating Secilia Apps/Extender: [name] in Treatment: 0 Wound Status Wound Number: 3 Primary Etiology: Pressure Ulcer Wound Location: Left Gluteus Wound Status: Open Wounding Event: Gradually Appeared Comorbid History: Hypertension Date Acquired: 11/24/2022 Weeks Of Treatment: 0 Clustered Wound: No Photos Wound Measurements Length: (cm) 3 Width: (cm) 2.7 Depth: (cm) 3.8 Area: (cm) 6.362 Volume: (cm) 24.175 % Reduction in Area: % Reduction in Volume: Epithelialization: None Tunneling: No Undermining: No Wound Description Classification: Category/Stage IV Exudate Amount: Medium Exudate Type: Serosanguineous Exudate Color: red, brown Foul Odor After Cleansing: No Slough/Fibrino Yes Wound Bed Granulation Amount: Large (67-100%) Exposed Structure Granulation Quality: Red Fascia Exposed: No Necrotic Amount: Small (1-33%) Fat Layer (Subcutaneous Tissue) Exposed: Yes Necrotic Quality: Adherent Slough Tendon Exposed: Yes Muscle Exposed: Yes Necrosis of Muscle: No Joint Exposed: No Bone Exposed: No Treatment Notes Wound #3 (Gluteus) Wound Laterality: Left Cleanser Normal Saline Discharge Instruction: Wash your hands with soap and water. Remove old dressing, discard into plastic bag and place into trash. Cleanse the wound with Normal Saline prior to applying a clean dressing using gauze sponges, not tissues or cotton balls. Do not scrub or use excessive force. Pat dry using gauze sponges, not tissue or cotton balls. Wound Cleanser Discharge Instruction: Wash your hands with soap and water. Remove old dressing, discard into plastic bag and place into trash. Cleanse the wound with Wound Cleanser prior to applying a clean dressing using gauze sponges, not tissues or cotton balls. Do not scrub or use excessive force. Pat dry using gauze sponges, not tissue or cotton  balls. Peri-Wound Care Topical Primary Dressing Gauze Discharge Instruction: moistened with Dakins 1/4 Solution Secondary Dressing Mitchell [name], Mitchell [name] ([id number]) [id number] Page 12 of 12 (BORDER) Zetuvit Plus SILICONE BORDER Dressing 4x4 (in/in) Discharge Instruction: Please do not put silicone bordered dressings under wraps. Use non-bordered dressing only. Secured With Compression Wrap Compression Stockings Facilities manager) Signed: 12/11/2023 9:21:19 AM By: Yevonne Pax RN Entered By: Yevonne Pax on 12/11/2023 09:21:19 -------------------------------------------------------------------------------- Vitals Details Patient Name: Date of Service: Mitchell [name], RO [name]. 12/11/2023 8:45 A  M Medical Record Number: [id number] Patient Account Number: [id number] Date of Birth/Sex: Treating RN: [birth year] (53 y.o. Judie Petit) Yevonne Pax Primary Care Terry Abila: Lindwood Qua Other Clinician: Referring Oniyah Rohe: Treating Ruhan Borak/Extender: [name] in Treatment: 0 Vital Signs Time Taken: 09:02 Temperature (F): 98.3 Height (in): 59 Pulse (bpm): 76 Source: Stated Respiratory Rate (breaths/min): 18 Weight (lbs): 170 Blood Pressure (mmHg): 131/76 Source: Stated Reference Range: 80 - 120 mg / dl Body Mass Index (BMI): 34.3 Electronic Signature(s) Signed: 12/15/2023 9:06:19 AM By: Yevonne Pax RN Entered By: Yevonne Pax on 12/11/2023 09:03:09

## 2023-12-18 ENCOUNTER — Encounter: Payer: Medicare Other | Admitting: Physician Assistant

## 2023-12-18 DIAGNOSIS — L89154 Pressure ulcer of sacral region, stage 4: Secondary | ICD-10-CM | POA: Diagnosis not present

## 2023-12-28 ENCOUNTER — Encounter: Payer: Medicare Other | Attending: Physician Assistant | Admitting: Physician Assistant

## 2023-12-28 DIAGNOSIS — N183 Chronic kidney disease, stage 3 unspecified: Secondary | ICD-10-CM | POA: Diagnosis not present

## 2023-12-28 DIAGNOSIS — L89314 Pressure ulcer of right buttock, stage 4: Secondary | ICD-10-CM | POA: Insufficient documentation

## 2023-12-28 DIAGNOSIS — Q774 Achondroplasia: Secondary | ICD-10-CM | POA: Diagnosis not present

## 2023-12-28 DIAGNOSIS — I120 Hypertensive chronic kidney disease with stage 5 chronic kidney disease or end stage renal disease: Secondary | ICD-10-CM | POA: Diagnosis not present

## 2023-12-28 DIAGNOSIS — L89154 Pressure ulcer of sacral region, stage 4: Secondary | ICD-10-CM | POA: Insufficient documentation

## 2023-12-28 DIAGNOSIS — L89324 Pressure ulcer of left buttock, stage 4: Secondary | ICD-10-CM | POA: Insufficient documentation

## 2024-01-04 ENCOUNTER — Encounter: Payer: Medicare Other | Admitting: Physician Assistant

## 2024-01-04 DIAGNOSIS — L89314 Pressure ulcer of right buttock, stage 4: Secondary | ICD-10-CM | POA: Diagnosis not present

## 2024-01-18 ENCOUNTER — Encounter: Payer: Medicare Other | Admitting: Physician Assistant

## 2024-01-18 DIAGNOSIS — L89314 Pressure ulcer of right buttock, stage 4: Secondary | ICD-10-CM | POA: Diagnosis not present

## 2024-02-01 ENCOUNTER — Ambulatory Visit: Payer: Medicare Other | Admitting: Physician Assistant

## 2024-02-04 DIAGNOSIS — J069 Acute upper respiratory infection, unspecified: Secondary | ICD-10-CM | POA: Diagnosis not present

## 2024-02-04 DIAGNOSIS — B349 Viral infection, unspecified: Secondary | ICD-10-CM | POA: Diagnosis not present

## 2024-02-10 ENCOUNTER — Ambulatory Visit: Admitting: Physician Assistant

## 2024-02-17 ENCOUNTER — Encounter: Attending: Internal Medicine | Admitting: Internal Medicine

## 2024-02-17 DIAGNOSIS — Q774 Achondroplasia: Secondary | ICD-10-CM | POA: Insufficient documentation

## 2024-02-17 DIAGNOSIS — L89154 Pressure ulcer of sacral region, stage 4: Secondary | ICD-10-CM | POA: Diagnosis present

## 2024-02-17 DIAGNOSIS — I129 Hypertensive chronic kidney disease with stage 1 through stage 4 chronic kidney disease, or unspecified chronic kidney disease: Secondary | ICD-10-CM | POA: Diagnosis not present

## 2024-02-17 DIAGNOSIS — N183 Chronic kidney disease, stage 3 unspecified: Secondary | ICD-10-CM | POA: Insufficient documentation

## 2024-03-16 ENCOUNTER — Ambulatory Visit: Admitting: Physician Assistant

## 2024-03-17 ENCOUNTER — Ambulatory Visit: Admitting: Physician Assistant

## 2024-03-24 ENCOUNTER — Encounter: Attending: Physician Assistant | Admitting: Physician Assistant

## 2024-03-24 DIAGNOSIS — Q774 Achondroplasia: Secondary | ICD-10-CM | POA: Insufficient documentation

## 2024-03-24 DIAGNOSIS — M4628 Osteomyelitis of vertebra, sacral and sacrococcygeal region: Secondary | ICD-10-CM | POA: Insufficient documentation

## 2024-03-24 DIAGNOSIS — N183 Chronic kidney disease, stage 3 unspecified: Secondary | ICD-10-CM | POA: Insufficient documentation

## 2024-03-24 DIAGNOSIS — I129 Hypertensive chronic kidney disease with stage 1 through stage 4 chronic kidney disease, or unspecified chronic kidney disease: Secondary | ICD-10-CM | POA: Insufficient documentation

## 2024-03-24 DIAGNOSIS — L89314 Pressure ulcer of right buttock, stage 4: Secondary | ICD-10-CM | POA: Diagnosis not present

## 2024-03-24 DIAGNOSIS — L89154 Pressure ulcer of sacral region, stage 4: Secondary | ICD-10-CM | POA: Insufficient documentation

## 2024-03-24 DIAGNOSIS — L89324 Pressure ulcer of left buttock, stage 4: Secondary | ICD-10-CM | POA: Insufficient documentation

## 2024-03-31 ENCOUNTER — Emergency Department

## 2024-03-31 ENCOUNTER — Other Ambulatory Visit: Payer: Self-pay

## 2024-03-31 ENCOUNTER — Inpatient Hospital Stay
Admission: EM | Admit: 2024-03-31 | Discharge: 2024-04-06 | DRG: 853 | Disposition: A | Discharge status: Skilled Nursing Facility | Source: Ambulatory Visit | Attending: Student | Admitting: Student

## 2024-03-31 ENCOUNTER — Encounter: Admitting: Physician Assistant

## 2024-03-31 DIAGNOSIS — D631 Anemia in chronic kidney disease: Secondary | ICD-10-CM | POA: Diagnosis present

## 2024-03-31 DIAGNOSIS — Z9359 Other cystostomy status: Secondary | ICD-10-CM | POA: Diagnosis not present

## 2024-03-31 DIAGNOSIS — L89154 Pressure ulcer of sacral region, stage 4: Secondary | ICD-10-CM | POA: Diagnosis present

## 2024-03-31 DIAGNOSIS — R6520 Severe sepsis without septic shock: Secondary | ICD-10-CM | POA: Diagnosis present

## 2024-03-31 DIAGNOSIS — L03115 Cellulitis of right lower limb: Secondary | ICD-10-CM | POA: Diagnosis present

## 2024-03-31 DIAGNOSIS — L89314 Pressure ulcer of right buttock, stage 4: Secondary | ICD-10-CM | POA: Diagnosis present

## 2024-03-31 DIAGNOSIS — L89313 Pressure ulcer of right buttock, stage 3: Secondary | ICD-10-CM

## 2024-03-31 DIAGNOSIS — F17210 Nicotine dependence, cigarettes, uncomplicated: Secondary | ICD-10-CM | POA: Diagnosis present

## 2024-03-31 DIAGNOSIS — E538 Deficiency of other specified B group vitamins: Secondary | ICD-10-CM | POA: Diagnosis present

## 2024-03-31 DIAGNOSIS — L89304 Pressure ulcer of unspecified buttock, stage 4: Secondary | ICD-10-CM | POA: Diagnosis not present

## 2024-03-31 DIAGNOSIS — N390 Urinary tract infection, site not specified: Secondary | ICD-10-CM | POA: Diagnosis not present

## 2024-03-31 DIAGNOSIS — N179 Acute kidney failure, unspecified: Secondary | ICD-10-CM | POA: Diagnosis present

## 2024-03-31 DIAGNOSIS — E778 Other disorders of glycoprotein metabolism: Secondary | ICD-10-CM | POA: Diagnosis present

## 2024-03-31 DIAGNOSIS — I96 Gangrene, not elsewhere classified: Secondary | ICD-10-CM | POA: Diagnosis present

## 2024-03-31 DIAGNOSIS — E8809 Other disorders of plasma-protein metabolism, not elsewhere classified: Secondary | ICD-10-CM | POA: Diagnosis present

## 2024-03-31 DIAGNOSIS — I5021 Acute systolic (congestive) heart failure: Secondary | ICD-10-CM | POA: Diagnosis not present

## 2024-03-31 DIAGNOSIS — R6510 Systemic inflammatory response syndrome (SIRS) of non-infectious origin without acute organ dysfunction: Secondary | ICD-10-CM

## 2024-03-31 DIAGNOSIS — Q774 Achondroplasia: Secondary | ICD-10-CM

## 2024-03-31 DIAGNOSIS — L8994 Pressure ulcer of unspecified site, stage 4: Secondary | ICD-10-CM | POA: Diagnosis not present

## 2024-03-31 DIAGNOSIS — I129 Hypertensive chronic kidney disease with stage 1 through stage 4 chronic kidney disease, or unspecified chronic kidney disease: Secondary | ICD-10-CM | POA: Diagnosis present

## 2024-03-31 DIAGNOSIS — E876 Hypokalemia: Secondary | ICD-10-CM | POA: Diagnosis present

## 2024-03-31 DIAGNOSIS — D649 Anemia, unspecified: Secondary | ICD-10-CM | POA: Diagnosis not present

## 2024-03-31 DIAGNOSIS — N35919 Unspecified urethral stricture, male, unspecified site: Secondary | ICD-10-CM | POA: Diagnosis present

## 2024-03-31 DIAGNOSIS — T502X5A Adverse effect of carbonic-anhydrase inhibitors, benzothiadiazides and other diuretics, initial encounter: Secondary | ICD-10-CM | POA: Diagnosis present

## 2024-03-31 DIAGNOSIS — Z515 Encounter for palliative care: Secondary | ICD-10-CM | POA: Diagnosis not present

## 2024-03-31 DIAGNOSIS — L089 Local infection of the skin and subcutaneous tissue, unspecified: Secondary | ICD-10-CM | POA: Diagnosis not present

## 2024-03-31 DIAGNOSIS — M4628 Osteomyelitis of vertebra, sacral and sacrococcygeal region: Secondary | ICD-10-CM | POA: Diagnosis present

## 2024-03-31 DIAGNOSIS — L0390 Cellulitis, unspecified: Secondary | ICD-10-CM | POA: Diagnosis not present

## 2024-03-31 DIAGNOSIS — A419 Sepsis, unspecified organism: Secondary | ICD-10-CM | POA: Diagnosis present

## 2024-03-31 DIAGNOSIS — Z604 Social exclusion and rejection: Secondary | ICD-10-CM | POA: Diagnosis present

## 2024-03-31 DIAGNOSIS — M4710 Other spondylosis with myelopathy, site unspecified: Secondary | ICD-10-CM | POA: Diagnosis present

## 2024-03-31 DIAGNOSIS — Q789 Osteochondrodysplasia, unspecified: Secondary | ICD-10-CM | POA: Diagnosis not present

## 2024-03-31 DIAGNOSIS — N1831 Chronic kidney disease, stage 3a: Secondary | ICD-10-CM | POA: Diagnosis present

## 2024-03-31 DIAGNOSIS — Z981 Arthrodesis status: Secondary | ICD-10-CM | POA: Diagnosis not present

## 2024-03-31 DIAGNOSIS — G8220 Paraplegia, unspecified: Secondary | ICD-10-CM | POA: Diagnosis present

## 2024-03-31 DIAGNOSIS — D508 Other iron deficiency anemias: Secondary | ICD-10-CM | POA: Diagnosis not present

## 2024-03-31 DIAGNOSIS — Z7401 Bed confinement status: Secondary | ICD-10-CM | POA: Diagnosis not present

## 2024-03-31 DIAGNOSIS — I10 Essential (primary) hypertension: Secondary | ICD-10-CM | POA: Insufficient documentation

## 2024-03-31 DIAGNOSIS — L89324 Pressure ulcer of left buttock, stage 4: Secondary | ICD-10-CM | POA: Diagnosis present

## 2024-03-31 DIAGNOSIS — L89323 Pressure ulcer of left buttock, stage 3: Secondary | ICD-10-CM

## 2024-03-31 DIAGNOSIS — G9529 Other cord compression: Secondary | ICD-10-CM | POA: Diagnosis present

## 2024-03-31 DIAGNOSIS — Z886 Allergy status to analgesic agent status: Secondary | ICD-10-CM

## 2024-03-31 DIAGNOSIS — Z91013 Allergy to seafood: Secondary | ICD-10-CM

## 2024-03-31 DIAGNOSIS — Z79899 Other long term (current) drug therapy: Secondary | ICD-10-CM | POA: Diagnosis not present

## 2024-03-31 DIAGNOSIS — Q788 Other specified osteochondrodysplasias: Secondary | ICD-10-CM

## 2024-03-31 DIAGNOSIS — N319 Neuromuscular dysfunction of bladder, unspecified: Secondary | ICD-10-CM | POA: Diagnosis present

## 2024-03-31 DIAGNOSIS — Z7189 Other specified counseling: Secondary | ICD-10-CM | POA: Diagnosis not present

## 2024-03-31 DIAGNOSIS — D509 Iron deficiency anemia, unspecified: Secondary | ICD-10-CM | POA: Diagnosis present

## 2024-03-31 DIAGNOSIS — N183 Chronic kidney disease, stage 3 unspecified: Secondary | ICD-10-CM | POA: Diagnosis not present

## 2024-03-31 HISTORY — DX: Chronic kidney disease, stage 3 unspecified: N18.30

## 2024-03-31 HISTORY — DX: Essential (primary) hypertension: I10

## 2024-03-31 HISTORY — DX: Other genetic causes of short stature: E34.328

## 2024-03-31 LAB — CBC WITH DIFFERENTIAL/PLATELET
Abs Immature Granulocytes: 0.04 10*3/uL (ref 0.00–0.07)
Basophils Absolute: 0 10*3/uL (ref 0.0–0.1)
Basophils Relative: 0 %
Eosinophils Absolute: 0 10*3/uL (ref 0.0–0.5)
Eosinophils Relative: 0 %
HCT: 31.6 % — ABNORMAL LOW (ref 39.0–52.0)
Hemoglobin: 9.8 g/dL — ABNORMAL LOW (ref 13.0–17.0)
Immature Granulocytes: 0 %
Lymphocytes Relative: 9 %
Lymphs Abs: 0.9 10*3/uL (ref 0.7–4.0)
MCH: 30.2 pg (ref 26.0–34.0)
MCHC: 31 g/dL (ref 30.0–36.0)
MCV: 97.2 fL (ref 80.0–100.0)
Monocytes Absolute: 0.5 10*3/uL (ref 0.1–1.0)
Monocytes Relative: 5 %
Neutro Abs: 8.7 10*3/uL — ABNORMAL HIGH (ref 1.7–7.7)
Neutrophils Relative %: 86 %
Platelets: 466 10*3/uL — ABNORMAL HIGH (ref 150–400)
RBC: 3.25 MIL/uL — ABNORMAL LOW (ref 4.22–5.81)
RDW: 16.5 % — ABNORMAL HIGH (ref 11.5–15.5)
WBC: 10.2 10*3/uL (ref 4.0–10.5)
nRBC: 0 % (ref 0.0–0.2)

## 2024-03-31 LAB — IRON AND TIBC
Iron: 22 ug/dL — ABNORMAL LOW (ref 45–182)
Saturation Ratios: 13 % — ABNORMAL LOW (ref 17.9–39.5)
TIBC: 174 ug/dL — ABNORMAL LOW (ref 250–450)
UIBC: 152 ug/dL

## 2024-03-31 LAB — FOLATE: Folate: 11.5 ng/mL (ref >5.9)

## 2024-03-31 LAB — URINALYSIS, W/ REFLEX TO CULTURE (INFECTION SUSPECTED)
Bacteria, UA: NONE SEEN
Bilirubin Urine: NEGATIVE
Glucose, UA: NEGATIVE mg/dL
Ketones, ur: NEGATIVE mg/dL
Nitrite: NEGATIVE
Protein, ur: 100 mg/dL — AB
Specific Gravity, Urine: 1.014 (ref 1.005–1.030)
Squamous Epithelial / HPF: 0 /HPF (ref 0–5)
WBC, UA: >50 WBC/hpf (ref 0–5)
pH: 5 (ref 5.0–8.0)

## 2024-03-31 LAB — COMPREHENSIVE METABOLIC PANEL WITH GFR
ALT: 13 U/L (ref 0–44)
AST: 26 U/L (ref 15–41)
Albumin: 2.6 g/dL — ABNORMAL LOW (ref 3.5–5.0)
Alkaline Phosphatase: 38 U/L (ref 38–126)
Anion gap: 13 (ref 5–15)
BUN: 31 mg/dL — ABNORMAL HIGH (ref 6–20)
CO2: 19 mmol/L — ABNORMAL LOW (ref 22–32)
Calcium: 8.1 mg/dL — ABNORMAL LOW (ref 8.9–10.3)
Chloride: 104 mmol/L (ref 98–111)
Creatinine, Ser: 2.3 mg/dL — ABNORMAL HIGH (ref 0.61–1.24)
GFR, Estimated: 34 mL/min — ABNORMAL LOW (ref >60)
Glucose, Bld: 92 mg/dL (ref 70–99)
Potassium: 4.1 mmol/L (ref 3.5–5.1)
Sodium: 136 mmol/L (ref 135–145)
Total Bilirubin: 1.5 mg/dL — ABNORMAL HIGH (ref 0.0–1.2)
Total Protein: 7.2 g/dL (ref 6.5–8.1)

## 2024-03-31 LAB — FERRITIN: Ferritin: 638 ng/mL — ABNORMAL HIGH (ref 24–336)

## 2024-03-31 LAB — RETICULOCYTES
Immature Retic Fract: 16.4 % — ABNORMAL HIGH (ref 2.3–15.9)
RBC.: 3.24 MIL/uL — ABNORMAL LOW (ref 4.22–5.81)
Retic Count, Absolute: 71 10*3/uL (ref 19.0–186.0)
Retic Ct Pct: 2.2 % (ref 0.4–3.1)

## 2024-03-31 LAB — LACTIC ACID, PLASMA
Lactic Acid, Venous: 1 mmol/L (ref 0.5–1.9)
Lactic Acid, Venous: 0.7 mmol/L (ref 0.5–1.9)

## 2024-03-31 MED ORDER — VANCOMYCIN HCL 10 G IV SOLR
1250.0000 mg | Freq: Once | INTRAVENOUS | Status: AC
  Administered 2024-03-31: 1250 mg via INTRAVENOUS
  Filled 2024-03-31: qty 25

## 2024-03-31 MED ORDER — ACETAMINOPHEN 650 MG RE SUPP: 650.0000 mg | Freq: Four times a day (QID) | RECTAL | Status: DC | PRN

## 2024-03-31 MED ORDER — ONDANSETRON HCL 4 MG/2ML IJ SOLN: 4.0000 mg | Freq: Four times a day (QID) | INTRAMUSCULAR | Status: DC | PRN

## 2024-03-31 MED ORDER — MORPHINE SULFATE (PF) 2 MG/ML IV SOLN: 2.0000 mg | INTRAVENOUS | Status: DC | PRN

## 2024-03-31 MED ORDER — PIPERACILLIN-TAZOBACTAM 3.375 G IVPB
3.3750 g | Freq: Three times a day (TID) | INTRAVENOUS | Status: DC
  Administered 2024-04-01 – 2024-04-04 (×11): 3.375 g via INTRAVENOUS
  Filled 2024-03-31 (×12): qty 50

## 2024-03-31 MED ORDER — HYDROCODONE-ACETAMINOPHEN 5-325 MG PO TABS: 1.0000 | ORAL_TABLET | ORAL | Status: DC | PRN

## 2024-03-31 MED ORDER — VANCOMYCIN VARIABLE DOSE PER UNSTABLE RENAL FUNCTION (PHARMACIST DOSING): Status: DC

## 2024-03-31 MED ORDER — ACETAMINOPHEN 325 MG PO TABS: 650.0000 mg | ORAL_TABLET | Freq: Four times a day (QID) | ORAL | Status: DC | PRN

## 2024-03-31 MED ORDER — PIPERACILLIN-TAZOBACTAM 3.375 G IVPB
3.3750 g | Freq: Once | INTRAVENOUS | Status: AC
  Administered 2024-03-31: 3.375 g via INTRAVENOUS
  Filled 2024-03-31: qty 50

## 2024-03-31 MED ORDER — FERROUS SULFATE 325 (65 FE) MG PO TABS
325.0000 mg | ORAL_TABLET | ORAL | Status: DC
  Administered 2024-04-01: 325 mg via ORAL
  Filled 2024-03-31 (×3): qty 1

## 2024-03-31 MED ORDER — FENOFIBRATE 54 MG PO TABS
54.0000 mg | ORAL_TABLET | Freq: Every day | ORAL | Status: DC
  Administered 2024-04-01 – 2024-04-06 (×6): 54 mg via ORAL
  Filled 2024-03-31 (×6): qty 1

## 2024-03-31 MED ORDER — METOPROLOL TARTRATE 25 MG PO TABS
25.0000 mg | ORAL_TABLET | Freq: Two times a day (BID) | ORAL | Status: DC
  Filled 2024-03-31: qty 1

## 2024-03-31 MED ORDER — ONDANSETRON HCL 4 MG PO TABS: 4.0000 mg | ORAL_TABLET | Freq: Four times a day (QID) | ORAL | Status: DC | PRN

## 2024-03-31 MED ORDER — SODIUM CHLORIDE 0.9 % IV BOLUS
500.0000 mL | Freq: Once | INTRAVENOUS | Status: AC
Start: 2024-03-31 — End: 2024-03-31
  Administered 2024-03-31: 500 mL via INTRAVENOUS

## 2024-03-31 MED ORDER — ENOXAPARIN SODIUM 30 MG/0.3ML IJ SOSY
30.0000 mg | PREFILLED_SYRINGE | INTRAMUSCULAR | Status: DC
  Administered 2024-03-31 – 2024-04-01 (×2): 30 mg via SUBCUTANEOUS
  Filled 2024-03-31 (×2): qty 0.3

## 2024-03-31 MED ORDER — FESOTERODINE FUMARATE ER 4 MG PO TB24
4.0000 mg | ORAL_TABLET | Freq: Every day | ORAL | Status: DC
  Administered 2024-04-01 – 2024-04-06 (×6): 4 mg via ORAL
  Filled 2024-03-31 (×6): qty 1

## 2024-03-31 MED ORDER — GABAPENTIN 300 MG PO CAPS
300.0000 mg | ORAL_CAPSULE | Freq: Two times a day (BID) | ORAL | Status: DC
  Administered 2024-03-31 – 2024-04-01 (×2): 300 mg via ORAL
  Filled 2024-03-31 (×2): qty 1

## 2024-03-31 NOTE — ED Provider Notes (Signed)
 [facility] Provider Note    Event Date/Time   First MD Initiated Contact with Patient 03/31/24 1620     (approximate)   History   Leg Swelling   HPI  Mitchell [name] is a 53 y.o. male who was sent over from wound care due to a sacral wound.  Patient sent in for evaluation for IV antibiotics.  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  Appears that patient may be on Levaquin currently but he is got full swelling and redness of his right leg and now.  Patient denies any fevers.  He is not ambulatory at baseline.  Patient has a known suprapubic catheter denies any issues with that.  Patient comes in tachycardic low-grade temperatures increased respiratory rate.  Physical Exam   Triage Vital Signs: ED Triage Vitals  Encounter Vitals Group     BP 03/31/24 1605 117/68     Systolic BP Percentile --      Diastolic BP Percentile --      Pulse Rate 03/31/24 1605 (!) 102     Resp 03/31/24 1605 (!) 22     Temp 03/31/24 1605 99.4 F (37.4 C)     Temp src --      SpO2 03/31/24 1605 90 %     Weight --      Height --      Head Circumference --      Peak Flow --      Pain Score 03/31/24 1602 0     Pain Loc --      Pain Education --      Exclude from Growth Chart --     Most recent vital signs: Vitals:   03/31/24 1605  BP: 117/68  Pulse: (!) 102  Resp: (!) 22  Temp: 99.4 F (37.4 C)  SpO2: 90%     General: Awake, no distress.  CV:  Good peripheral perfusion.  Resp:  Normal effort.  Abd:  No distention.  Soft and nontender Other:  Redness and warmth noted to his entire leg.  Got some sacral wounds noted without any obvious bone exposure but they do probe very deep.  He is got no obvious drainage coming out of them.  No redness or warmth of the testicles. 2+ edema noted bilaterally Good distal pulse   ED Results / Procedures / Treatments   Labs (all labs ordered are listed, but only abnormal results are  displayed) Labs Reviewed  LACTIC ACID, PLASMA  LACTIC ACID, PLASMA  COMPREHENSIVE METABOLIC PANEL WITH GFR  CBC WITH DIFFERENTIAL/PLATELET  URINALYSIS, W/ REFLEX TO CULTURE (INFECTION SUSPECTED)      PROCEDURES:  Critical Care performed: Yes, see critical care procedure note(s)  .Critical Care  Performed by: Lubertha Rush, MD Authorized by: Lubertha Rush, MD   Critical care provider statement:    Critical care time (minutes):  30   Critical care was necessary to treat or prevent imminent or life-threatening deterioration of the following conditions:  Sepsis   Critical care was time spent personally by me on the following activities:  Development of treatment plan with patient or surrogate, discussions with consultants, evaluation of patient's response to treatment, examination of patient, ordering and review of laboratory studies, ordering and review of radiographic studies, ordering and performing treatments and interventions, pulse oximetry, re-evaluation of patient's condition and review of old charts    MEDICATIONS ORDERED IN ED: Medications  piperacillin-tazobactam (ZOSYN) IVPB 3.375  g (has no administration in time range)  sodium chloride 0.9 % bolus 500 mL (has no administration in time range)     IMPRESSION / MDM / ASSESSMENT AND PLAN / ED COURSE  I reviewed the triage vital signs and the nursing notes.   Patient's presentation is most consistent with acute presentation with potential threat to life or bodily function.  Patient comes in with right leg swelling and redness.  I am concerned about cellulitis related to his known osteomyelitis.  Given patient is bedbound we will also get ultrasound to make sure no DVT.  Patient came with some reports from any wound culture and will discuss with pharmacy for adequate dosing of antibiotics.  Patient meets sepsis criteria with elevated heart rate elevated respiratory rate, blood cultures, lactate ordered.  Will give some  gentle hydration with 500 cc given patient has normal lactate, has significant leg swelling bilaterally and unknown EF.  CBC shows normal white count.  Hemoglobin is low at 9.8.  I reviewed however prior records from 2 weeks ago his hemoglobin was 9.9 and white count was 8.4  Creatinine on 4/18 was 1.5 and today is up to 2.3 so patient will get some fluids starting with 500 CC   Discussed with pharmacy erin who recommended Vanco and Zosyn to start.  Will discuss with hospital team for admission.  The patient is on the cardiac monitor to evaluate for evidence of arrhythmia and/or significant heart rate changes.      FINAL CLINICAL IMPRESSION(S) / ED DIAGNOSES   Final diagnoses:  Sepsis, due to unspecified organism, unspecified whether acute organ dysfunction present (HCC)  Cellulitis, unspecified cellulitis site  Sacral osteomyelitis (HCC)     Rx / DC Orders   ED Discharge Orders     None        Note:  This document was prepared using Dragon voice recognition software and may include unintentional dictation errors.   Lubertha Rush, MD 03/31/24 [name]

## 2024-03-31 NOTE — Assessment & Plan Note (Addendum)
 Creatinine 2.30 with bicarb 19(baseline creatinine 1.3 on 3/18 Received an IV fluid bolus in the ED Monitor renal function, renally adjust meds, avoid nephrotoxins

## 2024-03-31 NOTE — Assessment & Plan Note (Signed)
 Continue home meds

## 2024-03-31 NOTE — Assessment & Plan Note (Addendum)
 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  WBC and lactic acid normal - MRI pelvis 03/16/2024 showed :"sacral ulcer extending to the deep soft tissues with adjacent cellulitis. No fluid collection. Osteomyelitis involving the exposed S4/S5 vertebra. Resected coccygeal vertebra"  -Continue vancomycin and Zosyn - Surgical and wound care consults -Will keep n.p.o. from midnight in case OR debridement needed.

## 2024-03-31 NOTE — Assessment & Plan Note (Signed)
 Mitchell [name]

## 2024-03-31 NOTE — Consult Note (Signed)
 Pharmacy Antibiotic Note  ASSESSMENT: 53 y.o. male with PMH including dwarfism, sacral wound is presenting with cellulitis. Patient has poor renal function because he is in AKI. Pharmacy has been consulted to manage vancomycin dosing and they are also receiving Zosyn.  Patient measurements: Height: 4\' 10"  (147.3 cm) Weight: 56.7 kg (125 lb) IBW/kg (Calculated) : 45.4  Vital signs: Temp: 99 F (37.2 C) (05/08 1913) Temp Source: Oral (05/08 1913) BP: 120/67 (05/08 1913) Pulse Rate: 97 (05/08 1913) Recent Labs  Lab 03/31/24 1610  WBC 10.2  CREATININE 2.30*   Estimated Creatinine Clearance: 27.1 mL/min (A) (by C-G formula based on SCr of 2.3 mg/dL (H)).  Allergies: No Known Allergies  Antimicrobials this admission: Vancomycin 5/8 >> Zosyn 5/8 >>  Dose adjustments this admission: N/A  Microbiology results: 5/8 UCx: in process  PLAN: Administer vancomycin 1250mg  IV as a one-time dose for now given acute kidney injury. Half-life of vancomycin with current renal function estimated at 28 hours. Follow up renal function tomorrow to assess vancomycin dosing needs. Follow up culture results to assess for antibiotic optimization.  Thank you for allowing pharmacy to be a part of this patient's care.  Will M. Alva Jewels, PharmD Clinical Pharmacist 03/31/2024 8:48 PM

## 2024-03-31 NOTE — Assessment & Plan Note (Signed)
 Hemoglobin low at 9.8 but up from 8.9 on 3/18 Anemia panel Continue ferrous sulfate

## 2024-03-31 NOTE — Progress Notes (Signed)
 Received consult for IV. On review, noted IV started by ED. Consult cleared.

## 2024-03-31 NOTE — Assessment & Plan Note (Addendum)
 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

## 2024-03-31 NOTE — Consult Note (Signed)
 ED Pharmacy Antibiotic Sign Off An antibiotic consult was received from an ED provider for vancomycin per pharmacy dosing for sepsis. A chart review was completed to assess appropriateness.   The following one time order(s) were placed: Vancomycin 1250 mg   Further antibiotic and/or antibiotic pharmacy consults should be ordered by the admitting provider if indicated.   Thank you for allowing pharmacy to be a part of this patient's care.   Tarrell Debes, PharmD Pharmacy Resident  03/31/2024 6:49 PM

## 2024-03-31 NOTE — H&P (Signed)
 History and Physical    Patient: Mitchell [name]:[id number] DOB: 08-18-1971 DOA: 03/31/2024 DOS: the patient was seen and examined on 03/31/2024 PCP: Darylene Epley, MD  Patient coming from: SNF  Chief Complaint:  Chief Complaint  Patient presents with   Leg Swelling    HPI: Mitchell [name] is a 53 y.o. 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   He has no fever or chills.  Usual wound treatment includes wet-to-dry and Mepilex dressings. ED course and data review: Tachycardic to 102 with low-grade temp of 99.4, BP 117/68 Notable labs include WBC of 10.2 with lactic acid 0.7. Hemoglobin 9.8 (up from 8.9 on 3/18) Creatinine 2.30 with bicarb 19(baseline creatinine 1.3 on 3/18)_ Urinalysis with moderate leukocyte esterase  Right lower extremity venous ultrasound negative for DVT, showing possible Baker's cyst in right popliteal fossa  Chest x-ray clear  Patient started on Zosyn  and vancomycin  and given an IV fluid bolus  Hospitalist consulted for admission.     Past Medical History:  Diagnosis Date   CKD (chronic kidney disease), stage III (HCC)    Dwarfism    Hypertension    Past Surgical History:  Procedure Laterality Date   CHOLECYSTECTOMY     Social History:  reports that he has been smoking cigarettes. He does not have any smokeless tobacco history on file. He reports that he does not drink alcohol. No history on file for drug  use.  Allergies  Allergen Reactions   Nsaids    Fish Allergy Nausea And Vomiting   Shrimp Extract Nausea And Vomiting    History reviewed. No pertinent family history.  Prior to Admission medications   Medication Sig Start Date End Date Taking? Authorizing Provider  carbamide peroxide (DEBROX) 6.5 % OTIC solution Place 5 drops into both ears every 8 (eight) hours as needed.   Yes [provider]  clotrimazole-betamethasone (LOTRISONE) cream Apply 1 Application topically 2 (two) times daily.   Yes [provider]  fenofibrate  (TRICOR ) 48 MG tablet Take 48 mg by mouth daily.   Yes [provider]  ferrous sulfate  325 (65 FE) MG tablet Take 325 mg by mouth every other day.   Yes [provider]  gabapentin  (NEURONTIN ) 300 MG capsule Take 300 mg by mouth 2 (two) times daily. 03/17/22  Yes [provider]  ipratropium-albuterol (DUONEB) 0.5-2.5 (3) MG/3ML SOLN Inhale 3 mLs into the lungs every 6 (six) hours as needed. 02/10/24  Yes [provider]  ketoconazole (NIZORAL) 2 % cream Apply 1 Application topically 2 (two) times daily.   Yes [provider]  ketoconazole (NIZORAL) 2 % shampoo Apply 1 Application topically 2 (two) times a week.   Yes [provider]  metoprolol  tartrate (LOPRESSOR ) 25 MG tablet Take 25 mg by mouth 2 (two) times daily. 02/10/24  Yes [provider]  ondansetron  (ZOFRAN -ODT) 4 MG disintegrating tablet Take 4 mg by mouth every 8 (eight) hours as needed for nausea or vomiting. 06/19/22  Yes [provider]  solifenacin (VESICARE) 5 MG tablet Take 5 mg by mouth daily.   Yes [provider]    Physical Exam: Vitals:   03/31/24 1605 03/31/24 1913 03/31/24 1936 03/31/24 2030  BP: 117/68 120/67    Pulse: (!) 102 97    Resp: (!) 22 20    Temp: 99.4 F (37.4 C) 99 F (37.2 C)    TempSrc:  Oral    SpO2: 90% 96%    Weight:   56.7 kg   Height:    4\' 10"  (1.473 m)   Physical  Exam Vitals and nursing note reviewed.  Constitutional:      General: He is not in acute distress.    Comments: Patient awake and alert Noted short stature  HENT:     Head: Normocephalic and atraumatic.     Comments: Facies of achondroplasia  Cardiovascular:     Rate and Rhythm: Regular rhythm. Tachycardia present.     Heart sounds: Normal heart sounds.  Pulmonary:     Effort: Pulmonary effort is normal.     Breath sounds: Normal breath sounds.  Abdominal:     Palpations: Abdomen is soft.     Tenderness: There is no abdominal tenderness.     Comments: Suprapubic catheter  Musculoskeletal:     Right lower leg: Edema present.     Left lower leg: Edema present.     Comments: Swelling bilateral lower extremities but redness noted on the right, up to thigh See pic below  Skin:    Comments: Sacral and gluteus decubitus ulcers, malodorous See pic below  Neurological:     Mental Status: Mental status is at baseline.          Labs on Admission: I have personally reviewed following labs and imaging studies  CBC: Recent Labs  Lab 03/31/24 1610  WBC 10.2  NEUTROABS 8.7*  HGB 9.8*  HCT 31.6*  MCV 97.2  PLT 466*   Basic Metabolic Panel: Recent Labs  Lab 03/31/24 1610  NA 136  K 4.1  CL 104  CO2 19*  GLUCOSE 92  BUN 31*  CREATININE 2.30*  CALCIUM 8.1*   GFR: Estimated Creatinine Clearance: 27.1 mL/min (A) (by C-G formula based on SCr of 2.3 mg/dL (H)). Liver Function Tests: Recent Labs  Lab 03/31/24 1610  AST 26  ALT 13  ALKPHOS 38  BILITOT 1.5*  PROT 7.2  ALBUMIN 2.6*   No results for input(s): "LIPASE", "AMYLASE" in the last 168 hours. No results for input(s): "AMMONIA" in the last 168 hours. Coagulation Profile: No results for input(s): "INR", "PROTIME" in the last 168 hours. Cardiac Enzymes: No results for input(s): "CKTOTAL", "CKMB", "CKMBINDEX", "TROPONINI" in the last 168 hours. BNP (last 3 results) No results for input(s): "PROBNP" in the  last 8760 hours. HbA1C: No results for input(s): "HGBA1C" in the last 72 hours. CBG: No results for input(s): "GLUCAP" in the last 168 hours. Lipid Profile: No results for input(s): "CHOL", "HDL", "LDLCALC", "TRIG", "CHOLHDL", "LDLDIRECT" in the last 72 hours. Thyroid Function Tests: No results for input(s): "TSH", "T4TOTAL", "FREET4", "T3FREE", "THYROIDAB" in the last 72 hours. Anemia Panel: No results for input(s): "VITAMINB12", "FOLATE", "FERRITIN", "TIBC", "IRON", "RETICCTPCT" in the last 72 hours. Urine analysis:    Component Value Date/Time   COLORURINE YELLOW (A) 03/31/2024 1536   APPEARANCEUR TURBID (A) 03/31/2024 1536   APPEARANCEUR Clear 09/16/2013 1237   LABSPEC 1.014 03/31/2024 1536   LABSPEC 1.019 09/16/2013 1237   PHURINE 5.0 03/31/2024 1536   GLUCOSEU  NEGATIVE 03/31/2024 1536   GLUCOSEU Negative 09/16/2013 1237   HGBUR SMALL (A) 03/31/2024 1536   BILIRUBINUR NEGATIVE 03/31/2024 1536   BILIRUBINUR Negative 09/16/2013 1237   KETONESUR NEGATIVE 03/31/2024 1536   PROTEINUR 100 (A) 03/31/2024 1536   NITRITE NEGATIVE 03/31/2024 1536   LEUKOCYTESUR MODERATE (A) 03/31/2024 1536   LEUKOCYTESUR Negative 09/16/2013 1237    Radiological Exams on Admission: US  Venous Img Lower Unilateral Right Result Date: 03/31/2024 CLINICAL DATA:  Leg swelling. EXAM: RIGHT LOWER EXTREMITY VENOUS DOPPLER ULTRASOUND TECHNIQUE: Gray-scale sonography with compression, as well as color and duplex ultrasound, were performed to evaluate the deep venous system(s) from the level of the common femoral vein through the popliteal and proximal calf veins. COMPARISON:  None Available. FINDINGS: VENOUS Normal compressibility of the common femoral, superficial femoral, and popliteal veins, as well as the visualized calf veins. Visualized portions of profunda femoral vein and great saphenous vein unremarkable. No filling defects to suggest DVT on grayscale or color Doppler imaging. Doppler waveforms show normal  direction of venous flow, normal respiratory plasticity and response to augmentation. Limited views of the contralateral common femoral vein are unremarkable. OTHER Complex hypoechoic area identified in the popliteal fossa. This could be a complex fluid collection or Baker's cyst measuring 2.8 x 1.0 x 1.6 cm Limitations: none IMPRESSION: No evidence of right lower extremity DVT. Complex fluid collection suspected in the popliteal fossa measuring 2.8 cm. Possible Baker's cyst but nonspecific. Please correlate with clinical findings and further workup as clinically appropriate Electronically Signed   By: Adrianna Horde M.D.   On: 03/31/2024 19:28   DG Chest Portable 1 View Result Date: 03/31/2024 CLINICAL DATA:  Sepsis. EXAM: PORTABLE CHEST 1 VIEW COMPARISON:  03/20/2021. FINDINGS: Cardiac silhouette is enlarged, unchanged. No focal consolidation, pleural effusion, or pneumothorax. Fixation hardware again noted in the cervicothoracic and thoracolumbar spine. No acute osseous abnormality identified. IMPRESSION: No acute cardiopulmonary findings. Electronically Signed   By: Mannie Seek M.D.   On: 03/31/2024 16:39   Data Reviewed for HPI: Relevant notes from primary care and specialist visits, past discharge summaries as available in EHR, including Care Everywhere. 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  WBC and lactic acid normal - MRI pelvis 03/16/2024 showed :"sacral  ulcer extending to the deep soft tissues with adjacent cellulitis. No fluid collection. Osteomyelitis involving the exposed S4/S5 vertebra. Resected coccygeal vertebra"  -Continue vancomycin  and Zosyn  - Surgical and wound care consults -Will keep n.p.o. from midnight in case OR debridement needed.  Cellulitis of right leg Zosyn  and vancomycin  as above Keep leg elevated  Acute renal failure superimposed on stage 3a chronic kidney disease (HCC) Creatinine 2.30 with bicarb 19(baseline creatinine 1.3 on 3/18 Received an IV fluid bolus in the ED Monitor renal function, renally adjust meds, avoid nephrotoxins  Suprapubic catheter secondary to neurogenic bladder and urethral stricture (HCC) History of frequent UTIs History of Proteus bacteremia secondary to UTI 01/2024 Last suprapubic catheter exchanged by IR on 03/29/2024 Urinalysis with moderate leukocyte esterase Follow urine  culture  Chronic anemia Hemoglobin low at 9.8 but up from 8.9 on 3/18 Anemia panel Continue ferrous sulfate   HTN (hypertension) Continue home meds  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 Inpatient status is judged to be reasonable and necessary in order to provide the required intensity of service to ensure the patient's safety. The patient's presenting symptoms, physical exam findings, and initial radiographic and laboratory data in the context of their chronic comorbidities is felt to place them at high risk for further clinical deterioration. Furthermore, it is not anticipated that the patient will be medically stable for discharge from the hospital within 2 midnights of  admission.   * I certify that at the point of admission it is my clinical judgment that the patient will require inpatient hospital care spanning beyond 2 midnights from the point of admission due to high intensity of service, high risk for further deterioration and high frequency of surveillance required.*  Author: Lanetta Pion, MD 03/31/2024 10:15 PM  For on call review [email or web address].

## 2024-03-31 NOTE — ED Notes (Signed)
 RN to call phlebotomy for blood draw as well as IV team for line placement.

## 2024-03-31 NOTE — Assessment & Plan Note (Addendum)
 History of frequent UTIs History of Proteus bacteremia secondary to UTI 01/2024 Last suprapubic catheter exchanged by IR on 03/29/2024 Urinalysis with moderate leukocyte esterase Follow urine culture

## 2024-03-31 NOTE — ED Triage Notes (Signed)
 Patient was at wound care for sacral wound, sent over for erythema of right lower leg.

## 2024-04-01 DIAGNOSIS — E538 Deficiency of other specified B group vitamins: Secondary | ICD-10-CM

## 2024-04-01 DIAGNOSIS — D508 Other iron deficiency anemias: Secondary | ICD-10-CM

## 2024-04-01 DIAGNOSIS — M4628 Osteomyelitis of vertebra, sacral and sacrococcygeal region: Secondary | ICD-10-CM

## 2024-04-01 DIAGNOSIS — L03115 Cellulitis of right lower limb: Secondary | ICD-10-CM

## 2024-04-01 DIAGNOSIS — Z9359 Other cystostomy status: Secondary | ICD-10-CM

## 2024-04-01 DIAGNOSIS — D649 Anemia, unspecified: Secondary | ICD-10-CM

## 2024-04-01 DIAGNOSIS — N390 Urinary tract infection, site not specified: Secondary | ICD-10-CM

## 2024-04-01 DIAGNOSIS — L89313 Pressure ulcer of right buttock, stage 3: Secondary | ICD-10-CM

## 2024-04-01 DIAGNOSIS — R6510 Systemic inflammatory response syndrome (SIRS) of non-infectious origin without acute organ dysfunction: Secondary | ICD-10-CM | POA: Diagnosis not present

## 2024-04-01 DIAGNOSIS — L89323 Pressure ulcer of left buttock, stage 3: Secondary | ICD-10-CM

## 2024-04-01 DIAGNOSIS — L8994 Pressure ulcer of unspecified site, stage 4: Secondary | ICD-10-CM | POA: Diagnosis not present

## 2024-04-01 LAB — URINE CULTURE

## 2024-04-01 LAB — VITAMIN B12: Vitamin B-12: 113 pg/mL — ABNORMAL LOW (ref 180–914)

## 2024-04-01 LAB — LACTIC ACID, PLASMA
Lactic Acid, Venous: 1.6 mmol/L (ref 0.5–1.9)
Lactic Acid, Venous: 0.8 mmol/L (ref 0.5–1.9)

## 2024-04-01 LAB — HIV ANTIBODY (ROUTINE TESTING W REFLEX): HIV Screen 4th Generation wRfx: NONREACTIVE

## 2024-04-01 LAB — CREATININE, SERUM
Creatinine, Ser: 2.1 mg/dL — ABNORMAL HIGH (ref 0.61–1.24)
GFR, Estimated: 38 mL/min — ABNORMAL LOW (ref >60)

## 2024-04-01 MED ORDER — DAKINS (1/4 STRENGTH) 0.125 % EX SOLN
Freq: Two times a day (BID) | CUTANEOUS | Status: AC
  Administered 2024-04-02 – 2024-04-03 (×3): 1
  Filled 2024-04-01 (×2): qty 473
  Filled 2024-04-01: qty 1

## 2024-04-01 MED ORDER — MORPHINE SULFATE (PF) 2 MG/ML IV SOLN: 2.0000 mg | INTRAVENOUS | Status: DC | PRN

## 2024-04-01 MED ORDER — CYANOCOBALAMIN 1000 MCG/ML IJ SOLN
1000.0000 ug | Freq: Once | INTRAMUSCULAR | Status: AC
Start: 2024-04-01 — End: 2024-04-02
  Administered 2024-04-02: 1000 ug via INTRAMUSCULAR
  Filled 2024-04-01 (×4): qty 1

## 2024-04-01 MED ORDER — VITAMIN B-12 1000 MCG PO TABS
1000.0000 ug | ORAL_TABLET | Freq: Every day | ORAL | Status: DC
Start: 2024-04-02 — End: 2024-04-03
  Administered 2024-04-02: 1000 ug via ORAL
  Filled 2024-04-01: qty 1

## 2024-04-01 MED ORDER — ENSURE ENLIVE PO LIQD
237.0000 mL | Freq: Two times a day (BID) | ORAL | Status: DC
Start: 2024-04-02 — End: 2024-04-06
  Administered 2024-04-02 – 2024-04-04 (×5): 237 mL via ORAL

## 2024-04-01 NOTE — Progress Notes (Signed)
 Patient transported to 2A via bed, escorted by two staff,  for continued blood pressure and cardiac monitoring ,patient is alert, stable and  educated on reason for move.  Report given and patient received by 2A RN.

## 2024-04-01 NOTE — ED Notes (Signed)
 Patient repositioned onto right side with pillow placed under left buttock.

## 2024-04-01 NOTE — ED Notes (Signed)
 Patient repositioned to left side with pillow under right side to offload pressure from buttocks.

## 2024-04-01 NOTE — Plan of Care (Signed)
  Problem: Education: Goal: Knowledge of General Education information will improve Description: Including pain rating scale, medication(s)/side effects and non-pharmacologic comfort measures 04/01/2024 1923 by Gideon Kussmaul, RN Outcome: Progressing 04/01/2024 1923 by Gideon Kussmaul, RN Outcome: Progressing   Problem: Health Behavior/Discharge Planning: Goal: Ability to manage health-related needs will improve 04/01/2024 1923 by Gideon Kussmaul, RN Outcome: Progressing 04/01/2024 1923 by Gideon Kussmaul, RN Outcome: Progressing   Problem: Clinical Measurements: Goal: Ability to maintain clinical measurements within normal limits will improve 04/01/2024 1923 by Gideon Kussmaul, RN Outcome: Progressing 04/01/2024 1923 by Gideon Kussmaul, RN Outcome: Progressing Goal: Will remain free from infection 04/01/2024 1923 by Gideon Kussmaul, RN Outcome: Progressing 04/01/2024 1923 by Gideon Kussmaul, RN Outcome: Progressing Goal: Diagnostic test results will improve 04/01/2024 1923 by Gideon Kussmaul, RN Outcome: Progressing 04/01/2024 1923 by Gideon Kussmaul, RN Outcome: Progressing Goal: Respiratory complications will improve 04/01/2024 1923 by Gideon Kussmaul, RN Outcome: Progressing 04/01/2024 1923 by Gideon Kussmaul, RN Outcome: Progressing Goal: Cardiovascular complication will be avoided 04/01/2024 1923 by Gideon Kussmaul, RN Outcome: Progressing 04/01/2024 1923 by Gideon Kussmaul, RN Outcome: Progressing   Problem: Activity: Goal: Risk for activity intolerance will decrease 04/01/2024 1923 by Gideon Kussmaul, RN Outcome: Progressing 04/01/2024 1923 by Gideon Kussmaul, RN Outcome: Progressing   Problem: Coping: Goal: Level of anxiety will decrease 04/01/2024 1923 by Gideon Kussmaul, RN Outcome: Progressing 04/01/2024 1923 by  Gideon Kussmaul, RN Outcome: Progressing   Problem: Nutrition: Goal: Adequate nutrition will be maintained 04/01/2024 1923 by Gideon Kussmaul, RN Outcome: Progressing 04/01/2024 1923 by Gideon Kussmaul, RN Outcome: Progressing   Problem: Elimination: Goal: Will not experience complications related to bowel motility 04/01/2024 1923 by Gideon Kussmaul, RN Outcome: Progressing 04/01/2024 1923 by Gideon Kussmaul, RN Outcome: Progressing Goal: Will not experience complications related to urinary retention 04/01/2024 1923 by Gideon Kussmaul, RN Outcome: Progressing 04/01/2024 1923 by Gideon Kussmaul, RN Outcome: Progressing   Problem: Pain Managment: Goal: General experience of comfort will improve and/or be controlled 04/01/2024 1923 by Gideon Kussmaul, RN Outcome: Progressing 04/01/2024 1923 by Gideon Kussmaul, RN Outcome: Progressing   Problem: Safety: Goal: Ability to remain free from injury will improve 04/01/2024 1923 by Gideon Kussmaul, RN Outcome: Progressing 04/01/2024 1923 by Gideon Kussmaul, RN Outcome: Progressing

## 2024-04-01 NOTE — Consult Note (Signed)
 Date of Consultation:  04/01/2024  Requesting Physician:  Brion Cancel, MD  Reason for Consultation:  Sacral and gluteal decubitus wounds  History of Present Illness: Mitchell [name] is a 53 y.o. 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  He was sent to the ER yesterday by wound care team due to concern for worsening infection of his wounds.  The patient does not have sensation in the area and is unable to tell me if the areas are hurting or not.  Denies any fevers or chills.  He had labwork showing a normal WBC of 10.2, AKI with Cr of 2.3, and iron deficiency anemia.  No new imaging was done of the pelvis or wounds.    Past Medical History: Past Medical History:  Diagnosis Date   CKD (chronic kidney disease), stage III (HCC)    Dwarfism    Hypertension      Past Surgical History: Past Surgical History:  Procedure Laterality Date   CHOLECYSTECTOMY      Home Medications: Prior to Admission medications   Medication Sig Start Date End Date Taking? Authorizing Provider  carbamide peroxide (DEBROX) 6.5 % OTIC solution Place 5 drops into both ears every 8 (eight) hours as needed.   Yes [provider]  clotrimazole-betamethasone (LOTRISONE) cream Apply 1 Application topically 2 (two) times daily.   Yes [provider]  fenofibrate  (TRICOR ) 48 MG tablet Take 48 mg by mouth daily.   Yes [provider]  ferrous sulfate  325 (65 FE) MG tablet Take 325 mg by mouth every other day.   Yes [provider]  gabapentin  (NEURONTIN ) 300 MG capsule Take 300 mg by mouth 2 (two) times daily. 03/17/22  Yes [provider]  ipratropium-albuterol (DUONEB) 0.5-2.5 (3) MG/3ML SOLN Inhale 3 mLs into the lungs every 6 (six) hours as needed. 02/10/24  Yes [provider]  ketoconazole (NIZORAL) 2 % cream Apply 1 Application topically 2  (two) times daily.   Yes [provider]  ketoconazole (NIZORAL) 2 % shampoo Apply 1 Application topically 2 (two) times a week.   Yes [provider]  metoprolol  tartrate (LOPRESSOR ) 25 MG tablet Take 25 mg by mouth 2 (two) times daily. 02/10/24  Yes [provider]  ondansetron  (ZOFRAN -ODT) 4 MG disintegrating tablet Take 4 mg by mouth every 8 (eight) hours as needed for nausea or vomiting. 06/19/22  Yes [provider]  solifenacin (VESICARE) 5 MG tablet Take 5 mg by mouth daily.   Yes [provider]    Allergies: Allergies  Allergen Reactions   Nsaids    Fish Allergy Nausea And Vomiting   Shrimp Extract Nausea And Vomiting    Social History:  reports that he has been smoking cigarettes. He does not have any smokeless tobacco history on file. He reports that he does not drink alcohol. No history on file for drug use.   Family History: History reviewed. No pertinent family history.  Review of Systems: Review of Systems  Constitutional:  Negative for chills and fever.  Respiratory:  Negative for shortness of breath.   Cardiovascular:  Negative for chest pain.  Gastrointestinal:  Negative for nausea and vomiting.  Genitourinary:  Negative for dysuria.  Musculoskeletal:  Negative for myalgias.    Physical Exam BP 115/68 (BP Location: Left Arm)   Pulse 92   Temp 98 F (36.7 C) (Temporal)   Resp 18  Ht 4\' 10"  (1.473 m)   Wt 56.7 kg   SpO2 94%   BMI 26.13 kg/m  CONSTITUTIONAL: No acute distress HEENT:  Normocephalic, atraumatic, extraocular motion intact. RESPIRATORY:  Normal respiratory effort without pathologic use of accessory muscles. CARDIOVASCULAR:  Regular rhythm and rate SKIN: The patient has three wounds visible.  A left gluteal wound measures 4.5 cm in size, and contains a small amount of necrotic tissue at the base but no purulence and the wound does not track beyond.  A right gluteal wound measures about 3.5 cm also  with some necrotic tissue but no purulence.  The sacral wound is about 2 cm size, with chronic healing changes surrounding the wound.  Wound base is clean, with healthy granulation tissue and no necrosis.  The the gluteal wounds were debrided using scissors at bedside without complications.  Total area debrided on left 4 x 3 cm, and on right 3 x 2 cm.  All wounds were dressed with wet to dry gauze dressing, covered with mepilex foam dressings. NEUROLOGIC:  Motor and sensation is grossly normal.  Cranial nerves are grossly intact. PSYCH:  Alert and oriented to person, place and time. Affect is normal.  Laboratory Analysis: Results for orders placed or performed during the hospital encounter of 03/31/24 (from the past 24 hours)  Urinalysis, w/ Reflex to Culture (Infection Suspected) -Urine, Catheterized     Status: Abnormal   Collection Time: 03/31/24  3:36 PM  Result Value Ref Range   Specimen Source URINE, CATHETERIZED    Color, Urine YELLOW (A) YELLOW   APPearance TURBID (A) CLEAR   Specific Gravity, Urine 1.014 1.005 - 1.030   pH 5.0 5.0 - 8.0   Glucose, UA NEGATIVE NEGATIVE mg/dL   Hgb urine dipstick SMALL (A) NEGATIVE   Bilirubin Urine NEGATIVE NEGATIVE   Ketones, ur NEGATIVE NEGATIVE mg/dL   Protein, ur 657 (A) NEGATIVE mg/dL   Nitrite NEGATIVE NEGATIVE   Leukocytes,Ua MODERATE (A) NEGATIVE   RBC / HPF 21-50 0 - 5 RBC/hpf   WBC, UA >50 0 - 5 WBC/hpf   Bacteria, UA NONE SEEN NONE SEEN   Squamous Epithelial / HPF 0 0 - 5 /HPF   WBC Clumps PRESENT    Mucus PRESENT   Lactic acid, plasma     Status: None   Collection Time: 03/31/24  4:10 PM  Result Value Ref Range   Lactic Acid, Venous 1.0 0.5 - 1.9 mmol/L  Comprehensive metabolic panel     Status: Abnormal   Collection Time: 03/31/24  4:10 PM  Result Value Ref Range   Sodium 136 135 - 145 mmol/L   Potassium 4.1 3.5 - 5.1 mmol/L   Chloride 104 98 - 111 mmol/L   CO2 19 (L) 22 - 32 mmol/L   Glucose, Bld 92 70 - 99 mg/dL   BUN 31  (H) 6 - 20 mg/dL   Creatinine, Ser 8.46 (H) 0.61 - 1.24 mg/dL   Calcium 8.1 (L) 8.9 - 10.3 mg/dL   Total Protein 7.2 6.5 - 8.1 g/dL   Albumin 2.6 (L) 3.5 - 5.0 g/dL   AST 26 15 - 41 U/L   ALT 13 0 - 44 U/L   Alkaline Phosphatase 38 38 - 126 U/L   Total Bilirubin 1.5 (H) 0.0 - 1.2 mg/dL   GFR, Estimated 34 (L) >60 mL/min   Anion gap 13 5 - 15  CBC with Differential     Status: Abnormal   Collection Time: 03/31/24  4:10 PM  Result Value Ref Range   WBC 10.2 4.0 - 10.5 K/uL   RBC 3.25 (L) 4.22 - 5.81 MIL/uL   Hemoglobin 9.8 (L) 13.0 - 17.0 g/dL   HCT 40.9 (L) 81.1 - 91.4 %   MCV 97.2 80.0 - 100.0 fL   MCH 30.2 26.0 - 34.0 pg   MCHC 31.0 30.0 - 36.0 g/dL   RDW 78.2 (H) 95.6 - 21.3 %   Platelets 466 (H) 150 - 400 K/uL   nRBC 0.0 0.0 - 0.2 %   Neutrophils Relative % 86 %   Neutro Abs 8.7 (H) 1.7 - 7.7 K/uL   Lymphocytes Relative 9 %   Lymphs Abs 0.9 0.7 - 4.0 K/uL   Monocytes Relative 5 %   Monocytes Absolute 0.5 0.1 - 1.0 K/uL   Eosinophils Relative 0 %   Eosinophils Absolute 0.0 0.0 - 0.5 K/uL   Basophils Relative 0 %   Basophils Absolute 0.0 0.0 - 0.1 K/uL   Immature Granulocytes 0 %   Abs Immature Granulocytes 0.04 0.00 - 0.07 K/uL  Folate     Status: None   Collection Time: 03/31/24  4:10 PM  Result Value Ref Range   Folate 11.5 >5.9 ng/mL  Iron and TIBC     Status: Abnormal   Collection Time: 03/31/24  4:10 PM  Result Value Ref Range   Iron 22 (L) 45 - 182 ug/dL   TIBC 086 (L) 578 - 469 ug/dL   Saturation Ratios 13 (L) 17.9 - 39.5 %   UIBC 152 ug/dL  Ferritin     Status: Abnormal   Collection Time: 03/31/24  4:10 PM  Result Value Ref Range   Ferritin 638 (H) 24 - 336 ng/mL  Reticulocytes     Status: Abnormal   Collection Time: 03/31/24  4:10 PM  Result Value Ref Range   Retic Ct Pct 2.2 0.4 - 3.1 %   RBC. 3.24 (L) 4.22 - 5.81 MIL/uL   Retic Count, Absolute 71.0 19.0 - 186.0 K/uL   Immature Retic Fract 16.4 (H) 2.3 - 15.9 %  Lactic acid, plasma     Status:  None   Collection Time: 03/31/24  7:36 PM  Result Value Ref Range   Lactic Acid, Venous 0.7 0.5 - 1.9 mmol/L  Blood culture (routine x 2)     Status: None (Preliminary result)   Collection Time: 03/31/24  7:36 PM   Specimen: BLOOD  Result Value Ref Range   Specimen Description BLOOD BLOOD LEFT HAND    Special Requests      BOTTLES DRAWN AEROBIC AND ANAEROBIC Blood Culture results may not be optimal due to an inadequate volume of blood received in culture bottles   Culture      NO GROWTH < 12 HOURS Performed at [facility], [address] [place], [place] [address]    Report Status PENDING   Blood culture (routine x 2)     Status: None (Preliminary result)   Collection Time: 03/31/24  7:39 PM   Specimen: BLOOD  Result Value Ref Range   Specimen Description BLOOD RIGHT ANTECUBITAL    Special Requests      BOTTLES DRAWN AEROBIC AND ANAEROBIC Blood Culture adequate volume   Culture      NO GROWTH < 12 HOURS Performed at [facility], [address] [place], [place] [address]    Report Status PENDING   Creatinine, serum     Status: Abnormal   Collection Time: 04/01/24  5:52 AM  Result Value Ref Range   Creatinine, Ser 2.10 (H) 0.61 - 1.24 mg/dL   GFR, Estimated 38 (L) >60 mL/min    Imaging: US  Venous Img Lower Unilateral Right Result Date: 03/31/2024 CLINICAL DATA:  Leg swelling. EXAM: RIGHT LOWER EXTREMITY VENOUS DOPPLER ULTRASOUND TECHNIQUE: Gray-scale sonography with compression, as well as color and duplex ultrasound, were performed to evaluate the deep venous system(s) from the level of the common femoral vein through the popliteal and proximal calf veins. COMPARISON:  None Available. FINDINGS: VENOUS Normal compressibility of the common femoral, superficial femoral, and popliteal veins, as well as the visualized calf veins. Visualized portions of profunda femoral vein and great saphenous vein unremarkable. No filling defects to suggest DVT on grayscale or  color Doppler imaging. Doppler waveforms show normal direction of venous flow, normal respiratory plasticity and response to augmentation. Limited views of the contralateral common femoral vein are unremarkable. OTHER Complex hypoechoic area identified in the popliteal fossa. This could be a complex fluid collection or Baker's cyst measuring 2.8 x 1.0 x 1.6 cm Limitations: none IMPRESSION: No evidence of right lower extremity DVT. Complex fluid collection suspected in the popliteal fossa measuring 2.8 cm. Possible Baker's cyst but nonspecific. Please correlate with clinical findings and further workup as clinically appropriate Electronically Signed   By: Adrianna Horde M.D.   On: 03/31/2024 19:28   DG Chest Portable 1 View Result Date: 03/31/2024 CLINICAL DATA:  Sepsis. EXAM: PORTABLE CHEST 1 VIEW COMPARISON:  03/20/2021. FINDINGS: Cardiac silhouette is enlarged, unchanged. No focal consolidation, pleural effusion, or pneumothorax. Fixation hardware again noted in the cervicothoracic and thoracolumbar spine. No acute osseous abnormality identified. IMPRESSION: No acute cardiopulmonary findings. Electronically Signed   By: Mannie Seek M.D.   On: 03/31/2024 16:39    Assessment and Plan: This is a 53 y.o. male with sacral and gluteal decubitus wounds.  --Discussed with the patient that although there is some mild necrosis, this is not extensive and I was able to debride at bedside without complications.  Wounds dressed with wet to dry dressing.   --Recommend using Dakins solution to continue cleaning the wounds for the next three days with the wet to dry dressings.  After that can transition back to saline wet to dry. --No other surgeries needed at this point.  General Surgery team will sign off but please feel free to reach out if any questions or concerns. --Patient can continue following with the wound care center after discharge.  I spent 45 minutes dedicated to the care of this patient on the date  of this encounter to include pre-visit review of records, face-to-face time with the patient discussing diagnosis and management, and any post-visit coordination of care.   Marene Shape, MD [facility] Pg:  [phone]

## 2024-04-01 NOTE — Consult Note (Signed)
 Pharmacy Antibiotic Note  ASSESSMENT: 53 y.o. male with PMH including dwarfism, sacral wound is presenting with cellulitis. Patient has poor renal function because he is in AKI. Pharmacy has been consulted to manage vancomycin  dosing and they are also receiving Zosyn . Serum creatinine notably elevated but improved since admission  Patient measurements: Height: 4\' 10"  (147.3 cm) Weight: 56.7 kg (125 lb) IBW/kg (Calculated) : 45.4  Vital signs: Temp: 98 F (36.7 C) (05/09 0659) Temp Source: Temporal (05/09 0659) BP: 115/68 (05/09 0659) Pulse Rate: 92 (05/09 0659) Recent Labs  Lab 03/31/24 1610 04/01/24 0552  WBC 10.2  --   CREATININE 2.30* 2.10*   Estimated Creatinine Clearance: 29.7 mL/min (A) (by C-G formula based on SCr of 2.1 mg/dL (H)).  Allergies: Allergies  Allergen Reactions   Nsaids    Fish Allergy Nausea And Vomiting   Shrimp Extract Nausea And Vomiting    Antimicrobials this admission: vancomycin  5/8 >> Zosyn  5/8 >>  Microbiology results: 5/8 UCx: in process  PLAN: s/p vancomycin  1250mg  IV as a one-time dose 05/08 2102 (estimated peak 31 mcg/mL). Half-life of vancomycin  with current renal function estimated at 28 hours Random vancomycin  level ordered for am: this will be approximately 32 hours after the loading dose (estimated level approximately 11 mcg/mL at current renal function) Follow up renal function tomorrow to assess vancomycin  dosing needs. Follow up culture results to assess for antibiotic optimization.  Thank you for allowing pharmacy to be a part of this patient's care.  Barney Boozer, PharmD, BCPS Clinical Pharmacist 04/01/2024 7:30 AM

## 2024-04-01 NOTE — Assessment & Plan Note (Signed)
 Zosyn  and vancomycin  as above Keep leg elevated

## 2024-04-01 NOTE — Progress Notes (Signed)
 Progress Note   Patient: Mitchell [name]:[id number] DOB: February 01, 1971 DOA: 03/31/2024     1 DOS: the patient was seen and examined on 04/01/2024   Brief hospital course: ROBERTJAMES EISEL is a 53 y.o. 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 Surgery team evaluation appreciated, bedside debridement done. Continue vancomycin  and Zosyn , monitor vanco troughs. Continue to monitor vitals closely. Held patient's beta-blocker, gabapentin .  Avoid opiates given hypotension. Encourage oral fluids, diet. Transfer to progressive unit for close monitoring.  Cellulitis of right leg. Continue Zosyn  and vancomycin  as above Keep leg elevated.  Acute renal failure superimposed on stage 3a chronic kidney disease (HCC) Creatinine 2.10 worsened from baseline (baseline creatinine 1.3 on 3/18 Continue to monitor daily renal function Avoid nephrotoxins.  Suprapubic catheter secondary to neurogenic bladder and urethral stricture (HCC) History of frequent UTIs History of Proteus bacteremia secondary to UTI 01/2024 Last suprapubic catheter exchanged by IR on 03/29/2024 Urinalysis with moderate leukocyte esterase Follow urine culture.  Patient is already  on broad-spectrum antibiotics at this time.  Chronic anemia Hemoglobin low at 9.8 but up from 8.9 on 3/18 Iron and B12 level low, will give supplements. Continue ferrous sulfate   HTN (hypertension) Hold beta-blocker therapy given low blood pressures. Avoid opiates if blood pressures persistently low.  Spinal cord compression due to degenerative disorder of spinal column Chondrodystrophy No acute issues     Out of bed to chair. Incentive spirometry. Nursing supportive care. Fall, aspiration precautions. Diet:  Diet Orders (From admission, onward)     Start     Ordered   04/01/24 0923  Diet Heart Room service appropriate? Yes; Fluid consistency: Thin  Diet effective now       Question Answer Comment  Room service appropriate? Yes   Fluid consistency: Thin      04/01/24 0922           DVT prophylaxis: enoxaparin  (LOVENOX ) injection 30 mg Start: 03/31/24 2200  Level of care: Med-Surg   Code Status: Full Code  Subjective: Patient is seen and examined today morning, he is able to answer me. He is weak, not eating well. Appetite poor.   Physical Exam: Vitals:   04/01/24 0549 04/01/24 0659 04/01/24 0904 04/01/24 1636  BP: 92/61 115/68 (!) 91/58 (!) 88/54  Pulse: 86 92 96 (!) 136  Resp: 17 18 18 20   Temp: 98.1 F (36.7 C) 98 F (36.7 C) 98.1 F (36.7 C) 98.4 F (36.9 C)  TempSrc: Oral Temporal Oral Oral  SpO2: 100% 94% 98% 97%  Weight:      Height:        General - Middle aged Caucasian male, no apparent distress HEENT - PERRLA, EOMI, atraumatic head, non tender sinuses. Lung - Clear, basal rales, rhonchi, no wheezes. Heart - S1, S2 heard, no murmurs,  rubs, 2+ pedal edema right>left Abdomen - Soft, non tender obese chronic suprapubic cath. Neuro - Alert, awake and oriented, non focal exam. Skin - Warm and dry. Right leg redness, swelling noted.  Data Reviewed:      Latest Ref Rng & Units 03/31/2024    4:10 PM 01/30/2016   12:14 PM 09/25/2013    4:32 AM   CBC  WBC 4.0 - 10.5 K/uL 10.2  5.2  10.2   Hemoglobin 13.0 - 17.0 g/dL 9.8  11.9  14.7   Hematocrit 39.0 - 52.0 % 31.6  43.3  41.7   Platelets 150 - 400 K/uL 466  265  344       Latest Ref Rng & Units 04/01/2024    5:52 AM 03/31/2024    4:10 PM 01/30/2016   12:14 PM  BMP  Glucose 70 - 99 mg/dL  92  94   BUN 6 - 20 mg/dL  31  7   Creatinine 8.29 - 1.24 mg/dL 5.62  1.30  8.65   Sodium 135 - 145 mmol/L  136  137   Potassium 3.5 - 5.1 mmol/L  4.1  3.6   Chloride 98 - 111 mmol/L  104  107   CO2 22 - 32 mmol/L  19  21   Calcium 8.9 - 10.3 mg/dL  8.1  8.9    US  Venous Img Lower Unilateral Right Result Date: 03/31/2024 CLINICAL DATA:  Leg swelling. EXAM: RIGHT LOWER EXTREMITY VENOUS DOPPLER ULTRASOUND TECHNIQUE: Gray-scale sonography with compression, as well as color and duplex ultrasound, were performed to evaluate the deep venous system(s) from the level of the common femoral vein through the popliteal and proximal calf veins. COMPARISON:  None Available. FINDINGS: VENOUS Normal compressibility of the common femoral, superficial femoral, and popliteal veins, as well as the visualized calf veins. Visualized portions of profunda femoral vein and great saphenous vein unremarkable. No filling defects to suggest DVT on grayscale or color Doppler imaging. Doppler waveforms show normal direction of venous flow, normal respiratory plasticity and response to augmentation. Limited views of the contralateral common femoral vein are unremarkable. OTHER Complex hypoechoic area identified in the popliteal fossa. This could be a complex fluid collection or Baker's cyst measuring 2.8 x 1.0 x 1.6 cm Limitations: none IMPRESSION: No evidence of right lower extremity DVT. Complex fluid collection suspected in the popliteal fossa measuring 2.8 cm. Possible Baker's cyst but nonspecific. Please correlate with clinical findings and further workup as clinically appropriate Electronically Signed   By: Adrianna Horde M.D.   On:  03/31/2024 19:28   DG Chest Portable 1 View Result Date: 03/31/2024 CLINICAL DATA:  Sepsis. EXAM: PORTABLE CHEST 1 VIEW COMPARISON:  03/20/2021. FINDINGS: Cardiac silhouette is enlarged, unchanged. No focal consolidation, pleural effusion, or pneumothorax. Fixation hardware again noted in the cervicothoracic and thoracolumbar spine. No acute osseous abnormality identified. IMPRESSION: No acute cardiopulmonary findings. Electronically Signed   By: Mannie Seek M.D.   On: 03/31/2024 16:39    Family Communication: Discussed with patient, understand and agree. All questions answered.  Disposition: Status is: Inpatient Remains inpatient appropriate because: sepsis with low BP, need close monitoring.  Planned Discharge Destination: Skilled nursing facility     MDM level 3- Patient is at risk for severe sepsis. He is on broad spectrum IV antibiotics, IV fluids, need close monitoring in progressive care unit. High risk for sudden clinical deterioration.  Author: Aisha Hove, MD 04/01/2024 4:44 PM Secure chat 7am to 7pm For on call review [email or web address].

## 2024-04-01 NOTE — Plan of Care (Signed)
  Problem: Education: Goal: Knowledge of General Education information will improve Description: Including pain rating scale, medication(s)/side effects and non-pharmacologic comfort measures Outcome: Progressing   Problem: Health Behavior/Discharge Planning: Goal: Ability to manage health-related needs will improve Outcome: Progressing   Problem: Clinical Measurements: Goal: Ability to maintain clinical measurements within normal limits will improve Outcome: Progressing Goal: Will remain free from infection Outcome: Progressing Goal: Diagnostic test results will improve Outcome: Progressing Goal: Respiratory complications will improve Outcome: Progressing Goal: Cardiovascular complication will be avoided Outcome: Progressing   Problem: Activity: Goal: Risk for activity intolerance will decrease Outcome: Progressing   Problem: Nutrition: Goal: Adequate nutrition will be maintained Outcome: Progressing   Problem: Elimination: Goal: Will not experience complications related to bowel motility Outcome: Progressing Goal: Will not experience complications related to urinary retention Outcome: Progressing   Problem: Pain Managment: Goal: General experience of comfort will improve and/or be controlled Outcome: Progressing   Problem: Safety: Goal: Ability to remain free from injury will improve Outcome: Progressing   Problem: Skin Integrity: Goal: Risk for impaired skin integrity will decrease Outcome: Progressing   Problem: Clinical Measurements: Goal: Ability to avoid or minimize complications of infection will improve Outcome: Progressing

## 2024-04-02 DIAGNOSIS — R6510 Systemic inflammatory response syndrome (SIRS) of non-infectious origin without acute organ dysfunction: Secondary | ICD-10-CM | POA: Diagnosis not present

## 2024-04-02 DIAGNOSIS — L8994 Pressure ulcer of unspecified site, stage 4: Secondary | ICD-10-CM | POA: Diagnosis not present

## 2024-04-02 DIAGNOSIS — L03115 Cellulitis of right lower limb: Secondary | ICD-10-CM | POA: Diagnosis not present

## 2024-04-02 DIAGNOSIS — Z9359 Other cystostomy status: Secondary | ICD-10-CM | POA: Diagnosis not present

## 2024-04-02 LAB — CBC
HCT: 27.6 % — ABNORMAL LOW (ref 39.0–52.0)
Hemoglobin: 8.7 g/dL — ABNORMAL LOW (ref 13.0–17.0)
MCH: 29.8 pg (ref 26.0–34.0)
MCHC: 31.5 g/dL (ref 30.0–36.0)
MCV: 94.5 fL (ref 80.0–100.0)
Platelets: 338 10*3/uL (ref 150–400)
RBC: 2.92 MIL/uL — ABNORMAL LOW (ref 4.22–5.81)
RDW: 15.3 % (ref 11.5–15.5)
WBC: 4.6 10*3/uL (ref 4.0–10.5)
nRBC: 0 % (ref 0.0–0.2)

## 2024-04-02 LAB — BASIC METABOLIC PANEL WITH GFR
Anion gap: 8 (ref 5–15)
BUN: 24 mg/dL — ABNORMAL HIGH (ref 6–20)
CO2: 23 mmol/L (ref 22–32)
Calcium: 8.7 mg/dL — ABNORMAL LOW (ref 8.9–10.3)
Chloride: 106 mmol/L (ref 98–111)
Creatinine, Ser: 1.75 mg/dL — ABNORMAL HIGH (ref 0.61–1.24)
GFR, Estimated: 47 mL/min — ABNORMAL LOW (ref >60)
Glucose, Bld: 101 mg/dL — ABNORMAL HIGH (ref 70–99)
Potassium: 3.6 mmol/L (ref 3.5–5.1)
Sodium: 137 mmol/L (ref 135–145)

## 2024-04-02 LAB — VANCOMYCIN, RANDOM: Vancomycin Rm: 12 ug/mL

## 2024-04-02 MED ORDER — ENOXAPARIN SODIUM 40 MG/0.4ML IJ SOSY
40.0000 mg | PREFILLED_SYRINGE | INTRAMUSCULAR | Status: DC
  Administered 2024-04-02 – 2024-04-05 (×4): 40 mg via SUBCUTANEOUS
  Filled 2024-04-02 (×4): qty 0.4

## 2024-04-02 MED ORDER — VANCOMYCIN HCL IN DEXTROSE 1-5 GM/200ML-% IV SOLN
1000.0000 mg | Freq: Once | INTRAVENOUS | Status: AC
  Administered 2024-04-02: 1000 mg via INTRAVENOUS
  Filled 2024-04-02: qty 200

## 2024-04-02 NOTE — Consult Note (Signed)
 Pharmacy Antibiotic Note  ASSESSMENT: 53 y.o. male with PMH including dwarfism, sacral wound is presenting with cellulitis. Patient has poor renal function because he is in AKI. Pharmacy has been consulted to manage vancomycin  dosing and they are also receiving Zosyn . Serum creatinine notably elevated but improved since admission  Patient measurements: Height: 4\' 10"  (147.3 cm) Weight: 56.7 kg (125 lb) IBW/kg (Calculated) : 45.4  Vital signs: Temp: 97.7 F (36.5 C) (05/10 0801) Temp Source: Oral (05/10 0400) BP: 131/80 (05/10 0801) Pulse Rate: 103 (05/10 0801) Recent Labs  Lab 03/31/24 1610 04/01/24 0552 04/02/24 0742  WBC 10.2  --   --   CREATININE 2.30* 2.10* 1.75*   Estimated Creatinine Clearance: 35.6 mL/min (A) (by C-G formula based on SCr of 1.75 mg/dL (H)).  Allergies: Allergies  Allergen Reactions   Nsaids    Fish Allergy Nausea And Vomiting   Shrimp Extract Nausea And Vomiting    Antimicrobials this admission: vancomycin  5/8 >> Zosyn  5/8 >>  Microbiology results: 5/8 UCx: in process  PLAN: 5/10@0742 : vanc random 12 Will order vancomycin  1g IV x 1 Recheck Scr and random vancomycin  level tomorrow with morning labs. Follow up renal function tomorrow to assess vancomycin  dosing needs. Follow up culture results to assess for antibiotic optimization.  Thank you for allowing pharmacy to be a part of this patient's care.  Dorlis Judice A Jahlon Baines, PharmD Clinical Pharmacist 04/02/2024 8:15 AM

## 2024-04-02 NOTE — Progress Notes (Signed)
 Progress Note   Patient: Mitchell [name] DOB: Jan 16, 1971 DOA: 03/31/2024     2 DOS: the patient was seen and examined on 04/02/2024   Brief hospital course: Mitchell [name] is a 52 y.o. 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 Surgery team evaluation appreciated, bedside debridement done 04/01/24. Follow cultures. Continue vancomycin  and Zosyn , monitor vanco troughs. Continue to monitor vitals closely. Held patient's beta-blocker, gabapentin .  Avoid opiates given hypotension. Encourage oral fluids, diet. Monitor closely in progressive unit on telemetry.  Cellulitis of right leg. Continue Zosyn  and vancomycin  as above Keep leg elevated.  Acute renal failure superimposed on stage 3a chronic kidney disease (HCC) Creatinine improving with fluids. Continue to monitor daily renal function Avoid nephrotoxins.  Suprapubic catheter secondary to neurogenic bladder and urethral stricture (HCC) History of frequent UTIs History of Proteus bacteremia secondary to UTI 01/2024 Last suprapubic catheter exchanged by IR on 03/29/2024 Urinalysis with moderate leukocyte esterase Follow urine culture.  Patient is already on  broad-spectrum antibiotics at this time.  Chronic anemia Hemoglobin low at 9.8 but up from 8.9 on 3/18 Iron and B12 level low, will give supplements. Continue ferrous sulfate   HTN (hypertension) Hold beta-blocker therapy given low blood pressures. Avoid opiates if blood pressures persistently low.  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 Chondrodystrophy No acute issues Nursing supportive care. He is bed bound.     Out of bed to chair. Incentive spirometry. Nursing supportive care. Fall, aspiration precautions. Diet:  Diet Orders (From admission, onward)     Start     Ordered   04/01/24 0923  Diet Heart Room service appropriate? Yes; Fluid consistency: Thin  Diet effective now       Question Answer Comment  Room service appropriate? Yes   Fluid consistency: Thin      04/01/24 0922           DVT prophylaxis: enoxaparin  (LOVENOX ) injection 40 mg Start: 04/02/24 2200  Level of care: Progressive   Code Status: Full Code  Subjective: Patient is seen and examined today morning, he is alert, awake, able to answer me. Wishes to go home. Eating fair.  Physical Exam: Vitals:   04/01/24 2235 04/02/24 0400 04/02/24 0801 04/02/24 1148  BP: 100/61 130/83 131/80 117/74  Pulse: (!) 112 95 (!) 103 (!) 108  Resp: 18 14 16    Temp: 97.8 F (36.6 C) 97.6 F (36.4 C) 97.7 F (36.5 C) 98.4 F (36.9 C)  TempSrc: Oral Oral    SpO2: 100% 99% 99% 98%  Weight:      Height:        General - Middle aged Caucasian male, no apparent distress HEENT - PERRLA, EOMI, atraumatic head, non tender sinuses. Lung - Clear, basal rales, rhonchi, no wheezes. Heart - S1,  S2 heard, no murmurs, rubs, 2+ pedal edema right>left Abdomen - Soft, non tender obese chronic suprapubic cath. Neuro - Alert, awake and oriented, non focal exam. Skin - Warm and dry. Right leg redness, swelling noted.  Data Reviewed:      Latest Ref Rng & Units 04/02/2024     7:42 AM 03/31/2024    4:10 PM 01/30/2016   12:14 PM  CBC  WBC 4.0 - 10.5 K/uL 4.6  10.2  5.2   Hemoglobin 13.0 - 17.0 g/dL 8.7  9.8  16.1   Hematocrit 39.0 - 52.0 % 27.6  31.6  43.3   Platelets 150 - 400 K/uL 338  466  265       Latest Ref Rng & Units 04/02/2024    7:42 AM 04/01/2024    5:52 AM 03/31/2024    4:10 PM  BMP  Glucose 70 - 99 mg/dL 096   92   BUN 6 - 20 mg/dL 24   31   Creatinine 0.45 - 1.24 mg/dL 4.09  8.11  9.14   Sodium 135 - 145 mmol/L 137   136   Potassium 3.5 - 5.1 mmol/L 3.6   4.1   Chloride 98 - 111 mmol/L 106   104   CO2 22 - 32 mmol/L 23   19   Calcium 8.9 - 10.3 mg/dL 8.7   8.1    US  Venous Img Lower Unilateral Right Result Date: 03/31/2024 CLINICAL DATA:  Leg swelling. EXAM: RIGHT LOWER EXTREMITY VENOUS DOPPLER ULTRASOUND TECHNIQUE: Gray-scale sonography with compression, as well as color and duplex ultrasound, were performed to evaluate the deep venous system(s) from the level of the common femoral vein through the popliteal and proximal calf veins. COMPARISON:  None Available. FINDINGS: VENOUS Normal compressibility of the common femoral, superficial femoral, and popliteal veins, as well as the visualized calf veins. Visualized portions of profunda femoral vein and great saphenous vein unremarkable. No filling defects to suggest DVT on grayscale or color Doppler imaging. Doppler waveforms show normal direction of venous flow, normal respiratory plasticity and response to augmentation. Limited views of the contralateral common femoral vein are unremarkable. OTHER Complex hypoechoic area identified in the popliteal fossa. This could be a complex fluid collection or Baker's cyst measuring 2.8 x 1.0 x 1.6 cm Limitations: none IMPRESSION: No evidence of right lower extremity DVT. Complex fluid collection suspected in the popliteal fossa measuring 2.8 cm. Possible Baker's cyst but nonspecific. Please correlate with clinical findings and further workup as clinically appropriate  Electronically Signed   By: Adrianna Horde M.D.   On: 03/31/2024 19:28   DG Chest Portable 1 View Result Date: 03/31/2024 CLINICAL DATA:  Sepsis. EXAM: PORTABLE CHEST 1 VIEW COMPARISON:  03/20/2021. FINDINGS: Cardiac silhouette is enlarged, unchanged. No focal consolidation, pleural effusion, or pneumothorax. Fixation hardware again noted in the cervicothoracic and thoracolumbar spine. No acute osseous abnormality identified. IMPRESSION: No acute cardiopulmonary findings. Electronically Signed   By: Mannie Seek M.D.   On: 03/31/2024 16:39    Family Communication: Discussed with patient, understand and agree. All questions answered.  Disposition: Status is: Inpatient Remains inpatient appropriate because: sepsis with low BP, need close monitoring.  Planned Discharge Destination: Skilled nursing facility return in 1-2 days of abx, once cultures finalized.     MDM level 3- Patient is at risk for severe sepsis. He is on broad spectrum IV antibiotics, IV fluids, need close monitoring in progressive care unit. High risk for sudden clinical deterioration.  Author: Aisha Hove,  MD 04/02/2024 3:07 PM Secure chat 7am to 7pm For on call review [email or web address].

## 2024-04-03 DIAGNOSIS — Z515 Encounter for palliative care: Secondary | ICD-10-CM

## 2024-04-03 DIAGNOSIS — Z7189 Other specified counseling: Secondary | ICD-10-CM

## 2024-04-03 DIAGNOSIS — L0390 Cellulitis, unspecified: Secondary | ICD-10-CM

## 2024-04-03 DIAGNOSIS — A419 Sepsis, unspecified organism: Secondary | ICD-10-CM | POA: Diagnosis not present

## 2024-04-03 DIAGNOSIS — L089 Local infection of the skin and subcutaneous tissue, unspecified: Secondary | ICD-10-CM | POA: Diagnosis not present

## 2024-04-03 DIAGNOSIS — L8994 Pressure ulcer of unspecified site, stage 4: Secondary | ICD-10-CM | POA: Diagnosis not present

## 2024-04-03 LAB — CBC
HCT: 28.2 % — ABNORMAL LOW (ref 39.0–52.0)
Hemoglobin: 8.9 g/dL — ABNORMAL LOW (ref 13.0–17.0)
MCH: 29.4 pg (ref 26.0–34.0)
MCHC: 31.6 g/dL (ref 30.0–36.0)
MCV: 93.1 fL (ref 80.0–100.0)
Platelets: 388 10*3/uL (ref 150–400)
RBC: 3.03 MIL/uL — ABNORMAL LOW (ref 4.22–5.81)
RDW: 15.3 % (ref 11.5–15.5)
WBC: 5.5 10*3/uL (ref 4.0–10.5)
nRBC: 0 % (ref 0.0–0.2)

## 2024-04-03 LAB — BASIC METABOLIC PANEL WITH GFR
Anion gap: 9 (ref 5–15)
BUN: 21 mg/dL — ABNORMAL HIGH (ref 6–20)
CO2: 22 mmol/L (ref 22–32)
Calcium: 8.9 mg/dL (ref 8.9–10.3)
Chloride: 104 mmol/L (ref 98–111)
Creatinine, Ser: 1.48 mg/dL — ABNORMAL HIGH (ref 0.61–1.24)
GFR, Estimated: 57 mL/min — ABNORMAL LOW (ref >60)
Glucose, Bld: 125 mg/dL — ABNORMAL HIGH (ref 70–99)
Potassium: 3.6 mmol/L (ref 3.5–5.1)
Sodium: 135 mmol/L (ref 135–145)

## 2024-04-03 LAB — CREATININE, SERUM
Creatinine, Ser: 1.44 mg/dL — ABNORMAL HIGH (ref 0.61–1.24)
GFR, Estimated: 59 mL/min — ABNORMAL LOW (ref >60)

## 2024-04-03 LAB — VANCOMYCIN, RANDOM
Vancomycin Rm: 21 ug/mL
Vancomycin Rm: 16 ug/mL

## 2024-04-03 LAB — TSH: TSH: 3.303 u[IU]/mL (ref 0.350–4.500)

## 2024-04-03 LAB — BRAIN NATRIURETIC PEPTIDE: B Natriuretic Peptide: 13 pg/mL (ref 0.0–100.0)

## 2024-04-03 MED ORDER — VITAMIN B-12 1000 MCG PO TABS: 1000.0000 ug | ORAL_TABLET | Freq: Every day | ORAL | Status: DC

## 2024-04-03 MED ORDER — FUROSEMIDE 10 MG/ML IJ SOLN
40.0000 mg | Freq: Two times a day (BID) | INTRAMUSCULAR | Status: DC
  Administered 2024-04-03 – 2024-04-04 (×3): 40 mg via INTRAVENOUS
  Filled 2024-04-03 (×3): qty 4

## 2024-04-03 MED ORDER — POLYSACCHARIDE IRON COMPLEX 150 MG PO CAPS
150.0000 mg | ORAL_CAPSULE | Freq: Every day | ORAL | Status: DC
  Administered 2024-04-03 – 2024-04-06 (×4): 150 mg via ORAL
  Filled 2024-04-03 (×4): qty 1

## 2024-04-03 MED ORDER — VANCOMYCIN HCL 750 MG/150ML IV SOLN
750.0000 mg | INTRAVENOUS | Status: DC
  Administered 2024-04-03 – 2024-04-04 (×2): 750 mg via INTRAVENOUS
  Filled 2024-04-03 (×2): qty 150

## 2024-04-03 MED ORDER — CYANOCOBALAMIN 1000 MCG/ML IJ SOLN
1000.0000 ug | Freq: Every day | INTRAMUSCULAR | Status: DC
  Administered 2024-04-03 – 2024-04-06 (×4): 1000 ug via INTRAMUSCULAR
  Filled 2024-04-03 (×4): qty 1

## 2024-04-03 MED ORDER — VITAMIN C 500 MG PO TABS
500.0000 mg | ORAL_TABLET | Freq: Every day | ORAL | Status: DC
  Administered 2024-04-03 – 2024-04-04 (×2): 500 mg via ORAL
  Filled 2024-04-03 (×2): qty 1

## 2024-04-03 NOTE — Consult Note (Addendum)
 Consultation Note Date: 04/03/2024 at 1530   Patient Name: Mitchell [name]  DOB: 1971/09/30  MRN: [id number]  Age / Sex: 53 y.o., male  PCP: Darylene Epley, MD Referring Physician: Althia Atlas, MD  HPI/Patient Profile: 53 y.o. 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 He is followed at the wound care center after prior hospitalizations at [facility], 3/13-3/19/25 with Proteus bacteremia of urinary source. He was sent to ED by wound care center for IV antibiotics due to suspected chronic stage IV sacral wounds.   ED workup found patient to have swelling and redness to R leg, low-grade fever and tachypnea. ED labs revealed BUN 31, Creatinine 2.30, Ca+ 8.01, Albumin 2.6, Hgb 9.8, Hct 31.6, CXR negative.  Patient was admitted for further assessment and management of infected decubitus ulcer, stage IV, sacral osteomyelitis, cellulitis of right leg and ARF superimposed in stage 3a CKD.   Palliative consulted for goals of care discussion.   Clinical Assessment and Goals of Care: Extensive chart review completed prior to meeting patient including labs, vital signs, imaging, progress notes, orders, and available advanced directive documents from current and previous encounters. I then met with patient to discuss diagnosis prognosis, GOC, EOL wishes, disposition and options.  I introduced Palliative Medicine as specialized medical care for people living with serious illness. It focuses on providing relief from the symptoms and stress of a serious illness. The goal is to improve quality of life for both the patient and the family.  Patient reports he resides at [facility] in [place] and has  lived there for a few years.  He requires help in the morning with getting out of bed, dressing and bathing but is able to use wheelchair to get around facility. He feeds himself and participates in center activities.   We discussed patient's current illness and what it means in the larger context of patient's on-going co-morbidities.  Natural disease trajectory and expectations at EOL were discussed.  I attempted to elicit values and goals of care important to the patient. Mitchell [name] reports that his goal is to return to the center.     Advance directives, concepts specific to code status and rehospitalization were considered and discussed. At this time, patient wants to think about CPR and DNR discussion. He remains a full code.   Education offered regarding concept specific to human mortality and the limitations of medical interventions to prolong life when the body begins to fail to thrive.  Mitchell [name] reports that there is no family or friends to appoint to make decisions for him in the event that he is unable to make them himself. Discussed the importance of conversation to ensure that he is cared for to align with his wishes. Will provide living will paperwork and place consult to spiritual care to complete prior to d/c.    Discussed with patient  the importance of continued conversation with medical providers regarding overall plan of care and treatment options, ensuring decisions are within the context of the patient's values and GOCs.    Questions and concerns were addressed. The patient was encouraged to call with questions or concerns.   Primary Decision Maker PATIENT  Physical Exam Vitals reviewed.  Constitutional:      General: He is not in acute distress.    Appearance: He is not ill-appearing.  HENT:     Head: Atraumatic.     Mouth/Throat:     Mouth: Mucous membranes are moist.  Pulmonary:     Effort: Pulmonary effort is normal. No respiratory distress.  Skin:    General: Skin is  warm and dry.  Neurological:     Mental Status: He is alert and oriented to person, place, and time.  Psychiatric:        Mood and Affect: Mood normal.        Behavior: Behavior normal.        Thought Content: Thought content normal.        Judgment: Judgment normal.    Recommendations/Plan: FULL CODE status as previously documented    Continue current supportive interventions Living will paperwork to be completed with spiritual care Palliative to follow for continued goals of care conversation   Thank you for this consult. Palliative medicine will continue to follow and assist holistically.   Time Total: 55 minutes  Time spent includes: Detailed review of medical records (labs, imaging, vital signs), medically appropriate exam (mental status, respiratory, cardiac, skin), discussed with treatment team, counseling and educating patient, family and staff, documenting clinical information, medication management and coordination of care.     Ina Manas, Joyice Nodal- [facility] Palliative Medicine Team  04/03/2024 3:23 PM  Office [phone]  Pager [phone]     Please contact Palliative Medicine Team providers via [employer] for questions and concerns.

## 2024-04-03 NOTE — Progress Notes (Signed)
 Progress Note   Patient: Mitchell [name]:[id number] DOB: 09-08-1971 DOA: 03/31/2024     3 DOS: the patient was seen and examined on 04/03/2024   Brief hospital course: Mitchell [name] is a 53 y.o. 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   Assessment and Plan:  # Infected decubitus ulcer, stage IV  # Sacral osteomyelitis (MRI 03/06/24) with adjacent cellulitis # Stage IV decubitus ulcers  sacrum and right and left buttocks Possible sepsis with tachycardia, low BP. Surgery team evaluation appreciated, bedside debridement done 04/01/24. Follow cultures. Continue vancomycin  and Zosyn , monitor vanco troughs. Continue to monitor vitals closely. Held patient's beta-blocker, gabapentin .  Avoid opiates given hypotension. Encourage oral fluids, diet. Monitor closely in progressive unit on telemetry.  # Cellulitis of right leg. Continue Zosyn  and vancomycin  as above Keep leg elevated.  # Bilateral lower extremity edema Venous duplex right lower extremity negative for DVT 5/11 started on Lasix 40 mg IV twice daily TSH level 3.3 wnl, BNP 13 low F/u TTE   # Acute renal failure superimposed on stage 3a chronic kidney disease:  Creatinine improved s/p fluids. Continue to monitor daily renal function Avoid nephrotoxins.  Suprapubic catheter secondary to neurogenic bladder and urethral stricture History of frequent UTIs History of Proteus bacteremia secondary  to UTI 01/2024 Last suprapubic catheter exchanged by IR on 03/29/2024 Urinalysis with moderate leukocyte esterase urine culture: Grew multiple species, suggested recollection. Blood culture NGTD Patient is already on broad-spectrum antibiotics at this time.  Chronic anemia Hemoglobin low at 9.8 but up from 8.9 on 3/18 Iron and B12 level low, will give supplements. Continue ferrous sulfate   HTN (hypertension) Hold beta-blocker therapy given low blood pressures. Avoid opiates if blood pressures persistently low.  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 Chondrodystrophy No acute issues Nursing supportive care. He is bed bound.   Vitamin B12 deficiency: Started vitamin B12 1000 mcg IM injection daily during hospital stay, followed by oral supplement.  Follow-up PCP to repeat vitamin B12 level after 3 to 6 months.  Iron deficiency, transferrin saturation 13%, started oral iron supplement with vitamin C.  Follow with PCP repeat iron profile after 3 to 6 months      Out of bed to chair. Incentive spirometry. Nursing supportive care. Fall, aspiration precautions. Diet:  Diet Orders (From admission, onward)     Start     Ordered   04/01/24 0923  Diet Heart Room service appropriate? Yes; Fluid consistency: Thin  Diet effective now       Question Answer Comment  Room service appropriate? Yes   Fluid consistency: Thin      04/01/24 0922           DVT prophylaxis: enoxaparin  (LOVENOX ) injection 40 mg Start: 04/02/24 2200  Level of care: Progressive   Code Status: Full Code  Subjective: No significant events overnight, patient was resting comfortably. Patient has significant edema in the bilateral lower extremities, agreed with the diuresis. Management plan discussed and he verbalized understanding.  Physical Exam: Vitals:   04/02/24 2000 04/03/24 0000 04/03/24 0348 04/03/24 0752  BP: 109/77 115/69 115/71 131/73  Pulse: (!) 107  99 99 96  Resp: 14 17 16 17   Temp: 98.4 F (36.9 C) 98.2 F (36.8 C) 98.3 F (36.8 C) 97.9 F (36.6 C)  TempSrc: Oral Oral Oral Oral  SpO2: 97% 96% 95% 98%  Weight:      Height:        General - Middle aged Caucasian male, no apparent distress HEENT - PERRLA, EOMI, atraumatic head, non tender sinuses. Lung - Clear, basal rales, rhonchi, no wheezes. Heart - S1, S2 heard, no murmurs, rubs, 2+ pedal edema right>left Abdomen - Soft, non tender obese chronic suprapubic cath. Neuro - Alert, awake and oriented, non focal exam. Skin - Warm and dry. Right leg redness, swelling noted. B/L 2-3+ edema  Data Reviewed:      Latest Ref Rng & Units 04/03/2024    3:16 AM 04/02/2024    7:42 AM 03/31/2024    4:10 PM  CBC  WBC 4.0 - 10.5 K/uL 5.5  4.6  10.2   Hemoglobin 13.0 - 17.0 g/dL 8.9  8.7  9.8   Hematocrit 39.0 - 52.0 % 28.2  27.6  31.6   Platelets 150 - 400 K/uL 388  338  466       Latest Ref Rng & Units 04/03/2024   12:49 PM 04/03/2024    3:16 AM 04/02/2024    7:42 AM  BMP  Glucose 70 - 99 mg/dL  161  096   BUN 6 - 20 mg/dL  21  24   Creatinine 0.45 - 1.24 mg/dL 4.09  8.11  9.14   Sodium 135 - 145 mmol/L  135  137   Potassium 3.5 - 5.1 mmol/L  3.6  3.6   Chloride 98 - 111 mmol/L  104  106   CO2 22 - 32 mmol/L  22  23   Calcium 8.9 - 10.3 mg/dL  8.9  8.7    No results found.   Family Communication: Discussed with patient, understand and agree. All questions answered.  Disposition: Status is: Inpatient Remains inpatient appropriate because: sepsis with low BP, need close monitoring.  Planned Discharge Destination: Skilled nursing facility return in 1-2 days of abx, once cultures finalized.     MDM level 3- Patient is at risk for severe sepsis. He is on broad spectrum IV antibiotics, IV fluids, need close monitoring in progressive care unit. High risk for sudden clinical deterioration.  Total time spent: 55 min   Author: Althia Atlas, MD 04/03/2024 2:27 PM Secure chat  7am to 7pm For on call review [email or web address].

## 2024-04-03 NOTE — Consult Note (Signed)
 Pharmacy Antibiotic Note  ASSESSMENT: 53 y.o. male with PMH including dwarfism, sacral wound is presenting with cellulitis. Patient has poor renal function because he is in AKI. Pharmacy has been consulted to manage vancomycin  dosing and they are also receiving Zosyn . Serum creatinine notably elevated but improved since admission  Patient measurements: Height: 4\' 10"  (147.3 cm) Weight: 56.7 kg (125 lb) IBW/kg (Calculated) : 45.4  Vital signs: Temp: 97.9 F (36.6 C) (05/11 0752) Temp Source: Oral (05/11 0752) BP: 131/73 (05/11 0752) Pulse Rate: 96 (05/11 0752) Recent Labs  Lab 03/31/24 1610 04/01/24 0552 04/02/24 0742 04/03/24 0316 04/03/24 1249  WBC 10.2  --  4.6 5.5  --   CREATININE 2.30*   < > 1.75* 1.48* 1.44*   < > = values in this interval not displayed.   Estimated Creatinine Clearance: 43.3 mL/min (A) (by C-G formula based on SCr of 1.44 mg/dL (H)).  Allergies: Allergies  Allergen Reactions   Nsaids    Fish Allergy Nausea And Vomiting   Shrimp Extract Nausea And Vomiting    Antimicrobials this admission: vancomycin  5/8 >> Zosyn  5/8 >>  Microbiology results: 5/8 UCx: in process  PLAN: Day 4 of antibiotics Renal function back to patient's baseline(Scr1.3-1.5). Will transition to AUC dosing Vancomycin  750 mg IV Q 24 hrs. Goal AUC 400-550. Expected AUC: 495.1 Expected Cmin: 13.3 SCr used: 1.44, Vd used: 0.72 Follow up renal function tomorrow to assess vancomycin  dosing needs. Follow up culture results to assess for antibiotic optimization.  Thank you for allowing pharmacy to be a part of this patient's care.  Kayden Amend A Macaila Tahir, PharmD Clinical Pharmacist 04/03/2024 1:40 PM

## 2024-04-03 NOTE — Progress Notes (Addendum)
 Responded to IV team consult to place PIV.  BUE were assessed with US .  A suitable vein was located in the LFA and a PIV was placed, but patient's vasculature is extremely limited.  If vancomycin  is continued after 5/12 (first dose was 5/8) then consider placement of a PICC or other central line as vancomycin  is to be administered via PIV <6 days only.

## 2024-04-03 NOTE — Care Management Important Message (Signed)
 Important Message  Patient Details  Name: Mitchell [name] MRN: [id number] Date of Birth: [birth year]   Important Message Given:  Yes - Medicare IM     Anise Kerns 04/03/2024, 2:08 PM

## 2024-04-04 DIAGNOSIS — Z7189 Other specified counseling: Secondary | ICD-10-CM | POA: Diagnosis not present

## 2024-04-04 DIAGNOSIS — A419 Sepsis, unspecified organism: Secondary | ICD-10-CM | POA: Diagnosis not present

## 2024-04-04 DIAGNOSIS — Z515 Encounter for palliative care: Secondary | ICD-10-CM | POA: Diagnosis not present

## 2024-04-04 DIAGNOSIS — I96 Gangrene, not elsewhere classified: Secondary | ICD-10-CM

## 2024-04-04 DIAGNOSIS — L03115 Cellulitis of right lower limb: Secondary | ICD-10-CM | POA: Diagnosis not present

## 2024-04-04 DIAGNOSIS — Q789 Osteochondrodysplasia, unspecified: Secondary | ICD-10-CM

## 2024-04-04 DIAGNOSIS — L89304 Pressure ulcer of unspecified buttock, stage 4: Secondary | ICD-10-CM

## 2024-04-04 DIAGNOSIS — L8994 Pressure ulcer of unspecified site, stage 4: Secondary | ICD-10-CM | POA: Diagnosis not present

## 2024-04-04 DIAGNOSIS — L089 Local infection of the skin and subcutaneous tissue, unspecified: Secondary | ICD-10-CM | POA: Diagnosis not present

## 2024-04-04 DIAGNOSIS — M4710 Other spondylosis with myelopathy, site unspecified: Secondary | ICD-10-CM

## 2024-04-04 LAB — BASIC METABOLIC PANEL WITH GFR
Anion gap: 11 (ref 5–15)
BUN: 20 mg/dL (ref 6–20)
CO2: 26 mmol/L (ref 22–32)
Calcium: 9.2 mg/dL (ref 8.9–10.3)
Chloride: 100 mmol/L (ref 98–111)
Creatinine, Ser: 1.74 mg/dL — ABNORMAL HIGH (ref 0.61–1.24)
GFR, Estimated: 47 mL/min — ABNORMAL LOW (ref >60)
Glucose, Bld: 105 mg/dL — ABNORMAL HIGH (ref 70–99)
Potassium: 4 mmol/L (ref 3.5–5.1)
Sodium: 137 mmol/L (ref 135–145)

## 2024-04-04 LAB — CBC
HCT: 30.6 % — ABNORMAL LOW (ref 39.0–52.0)
Hemoglobin: 9.7 g/dL — ABNORMAL LOW (ref 13.0–17.0)
MCH: 29.4 pg (ref 26.0–34.0)
MCHC: 31.7 g/dL (ref 30.0–36.0)
MCV: 92.7 fL (ref 80.0–100.0)
Platelets: 426 10*3/uL — ABNORMAL HIGH (ref 150–400)
RBC: 3.3 MIL/uL — ABNORMAL LOW (ref 4.22–5.81)
RDW: 15.2 % (ref 11.5–15.5)
WBC: 5.7 10*3/uL (ref 4.0–10.5)
nRBC: 0 % (ref 0.0–0.2)

## 2024-04-04 LAB — PHOSPHORUS: Phosphorus: 3.7 mg/dL (ref 2.5–4.6)

## 2024-04-04 LAB — MAGNESIUM: Magnesium: 1.7 mg/dL (ref 1.7–2.4)

## 2024-04-04 MED ORDER — FUROSEMIDE 10 MG/ML IJ SOLN
20.0000 mg | Freq: Two times a day (BID) | INTRAMUSCULAR | Status: DC
  Administered 2024-04-04 – 2024-04-06 (×4): 20 mg via INTRAVENOUS
  Filled 2024-04-04 (×4): qty 2

## 2024-04-04 MED ORDER — CEFAZOLIN SODIUM-DEXTROSE 1-4 GM/50ML-% IV SOLN
1.0000 g | Freq: Two times a day (BID) | INTRAVENOUS | Status: DC
  Administered 2024-04-04 – 2024-04-05 (×2): 1 g via INTRAVENOUS
  Filled 2024-04-04 (×2): qty 50

## 2024-04-04 MED ORDER — ADULT MULTIVITAMIN W/MINERALS CH
1.0000 | ORAL_TABLET | Freq: Every day | ORAL | Status: DC
  Administered 2024-04-04 – 2024-04-06 (×3): 1 via ORAL
  Filled 2024-04-04 (×4): qty 1

## 2024-04-04 MED ORDER — VITAMIN C 500 MG PO TABS
500.0000 mg | ORAL_TABLET | Freq: Two times a day (BID) | ORAL | Status: DC
  Administered 2024-04-04 – 2024-04-06 (×4): 500 mg via ORAL
  Filled 2024-04-04 (×5): qty 1

## 2024-04-04 NOTE — Progress Notes (Signed)
 Progress Note   Patient: Mitchell [name] [name]:[id number] DOB: 04/12/1971 DOA: 03/31/2024     4 DOS: the patient was seen and examined on 04/04/2024   Brief hospital course: Mitchell [name] is a 53 y.o. 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   Assessment and Plan:  # Decubitus ulcer, stage IV  # Sacral osteomyelitis (MRI 03/06/24) with adjacent cellulitis # Stage IV decubitus ulcers  sacrum and right and left buttocks Possible sepsis with tachycardia, low BP. Surgery team evaluation appreciated, bedside debridement done 04/01/24. Continue vancomycin  and Zosyn , monitor vanco troughs. Continue to monitor vitals  Held patient's beta-blocker, gabapentin .  Avoid opiates given hypotension. Encourage oral fluids, diet. Monitor closely in progressive unit on telemetry. ID consulted for antibiotics  # Cellulitis of right leg. Continue Zosyn  and vancomycin  as above Keep leg elevated.  # Bilateral lower extremity edema Venous duplex right lower extremity negative for DVT 5/11 started on Lasix 40 mg IV twice daily TSH level 3.3 wnl, BNP 13 low 5/12 edema improved, patient received Lasix 40 mg dose in the morning, decreased to 20 mg IV twice daily F/u TTE   # Acute renal failure superimposed on stage 3a chronic kidney disease:  Creatinine improved s/p fluids. Continue to monitor daily renal function Avoid nephrotoxins.  Suprapubic catheter secondary to  neurogenic bladder and urethral stricture History of frequent UTIs History of Proteus bacteremia secondary to UTI 01/2024 Last suprapubic catheter exchanged by IR on 03/29/2024 Urinalysis with moderate leukocyte esterase urine culture: Grew multiple species, suggested recollection. Blood culture NGTD Patient is already on broad-spectrum antibiotics at this time.  Chronic anemia Hemoglobin low at 9.8 but up from 8.9 on 3/18 Iron and B12 level low, will give supplements. Continue ferrous sulfate   HTN (hypertension) Hold beta-blocker therapy given low blood pressures. Avoid opiates if blood pressures persistently low.  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 Chondrodystrophy No acute issues Nursing supportive care. He is bed bound.   Vitamin B12 deficiency: Started vitamin B12 1000 mcg IM injection daily during hospital stay, followed by oral supplement.  Follow-up PCP to repeat vitamin B12 level after 3 to 6 months.  Iron deficiency, transferrin saturation 13%, started oral iron supplement with vitamin C.  Follow with PCP repeat iron profile after 3 to 6 months      Out of bed to chair. Incentive spirometry. Nursing supportive care. Fall, aspiration precautions. Diet:  Diet Orders (From admission, onward)     Start     Ordered   04/04/24 1402  Diet regular Fluid consistency: Thin  Diet effective now       Question:  Fluid consistency:  Answer:  Thin   04/04/24 1401           DVT prophylaxis: enoxaparin  (LOVENOX ) injection 40 mg Start: 04/02/24 2200  Level of care: Progressive   Code Status: Full Code  Subjective: No significant events overnight, patient denies any complaints. Patient was asking about going home on antibiotics.  Patient was informed  that we have consulted ID for antibiotics and duration.  So we need to wait and still has some cellulitis in the right lower extremity, we will continue same antibiotics and IV  diuresis today. We may plan to discharge him tomorrow a.m. if he will be cleared by ID. Patient verbalized understanding.   Physical Exam: Vitals:   04/04/24 0329 04/04/24 0723 04/04/24 1143 04/04/24 1637  BP: 110/77 133/77 115/76 128/87  Pulse: (!) 113 (!) 110 (!) 110 (!) 109  Resp: 18 16 16 18   Temp: 97.8 F (36.6 C) 98.2 F (36.8 C) 98.7 F (37.1 C) 97.6 F (36.4 C)  TempSrc: Oral Oral Oral   SpO2: 96% 97% 97% 94%  Weight:      Height:        General - Middle aged Caucasian male, no apparent distress HEENT - PERRLA, EOMI, atraumatic head, non tender sinuses. Lung - Clear, basal rales, rhonchi, no wheezes. Heart - S1, S2 heard, no murmurs, rubs, 2+ pedal edema right>left Abdomen - Soft, non tender obese chronic suprapubic cath. Neuro - Alert, awake and oriented, non focal exam. Skin -Right leg redness, B/L 2-3+ edema, improved as compared to yesterday  Data Reviewed:      Latest Ref Rng & Units 04/04/2024   10:48 AM 04/03/2024    3:16 AM 04/02/2024    7:42 AM  CBC  WBC 4.0 - 10.5 K/uL 5.7  5.5  4.6   Hemoglobin 13.0 - 17.0 g/dL 9.7  8.9  8.7   Hematocrit 39.0 - 52.0 % 30.6  28.2  27.6   Platelets 150 - 400 K/uL 426  388  338       Latest Ref Rng & Units 04/04/2024   10:48 AM 04/03/2024   12:49 PM 04/03/2024    3:16 AM  BMP  Glucose 70 - 99 mg/dL 102   725   BUN 6 - 20 mg/dL 20   21   Creatinine 3.66 - 1.24 mg/dL 4.40  3.47  4.25   Sodium 135 - 145 mmol/L 137   135   Potassium 3.5 - 5.1 mmol/L 4.0   3.6   Chloride 98 - 111 mmol/L 100   104   CO2 22 - 32 mmol/L 26   22   Calcium 8.9 - 10.3 mg/dL 9.2   8.9    No results found.   Family Communication: Discussed with patient, understand and agree. All questions answered.  Disposition: Status is: Inpatient Remains inpatient appropriate because: sepsis with low BP, need close monitoring.  Planned Discharge Destination: Skilled nursing facility return in 1-2 days of abx, once cultures finalized.     MDM  level 3- Patient is at risk for severe sepsis. He is on broad spectrum IV antibiotics, IV fluids, need close monitoring in progressive care unit. High risk for sudden clinical deterioration.  Total time spent: 40 min   Author: Althia Atlas, MD 04/04/2024 7:07 PM Secure chat 7am to 7pm For on call review [email or web address].

## 2024-04-04 NOTE — Consult Note (Signed)
 NAME: Mitchell [name]  DOB: 1971-07-31  MRN: [id number]  Date/Time: 04/04/2024 8:04 PM  REQUESTING PROVIDER: Dr. Hubert Madden Subjective:  REASON FOR CONSULT: Sacral decubitus ? Mitchell [name] is a 53 y.o. male with a history of achondroplasia spinal cord compression, neurogenic bladder, indwelling suprapubic catheter, chronic sacral wounds, history of spinal surgery, history of Proteus bacteremia and UTI in March 2025 presents from [facility] skilled nursing facility for erythema of the right lower leg. Patient had gone to see His wound care consultant on 03/31/2024 and they noted his right leg was swollen and erythematous and they sent him to the hospital for cellulitis. The antibiotic history includes a month of ciprofloxacin in January 2025 and a week of cefdinir in March 2025. Vitals in the ED on 03/31/2024 BP 117/68 Temperature 99.4 Pulse 102 Respiratory rate 22 and sats of 90% WBC 10.2 Hemoglobin 9.8 Platelet 466 Creatinine 2.30  Blood culture was sent. Chest x-ray was normal He was started on vancomycin  and cefepime  Past Medical History:  Diagnosis Date   CKD (chronic kidney disease), stage III (HCC)    Dwarfism    Hypertension     Past Surgical History:  Procedure Laterality Date   CHOLECYSTECTOMY    Spine fusion surgery Social History   Socioeconomic History   Marital status: Single    Spouse name: Not on file   Number of children: Not on file   Years of education: Not on file   Highest education level: Not on file  Occupational History   Not on file  Tobacco Use   Smoking status: Every Day    Types: Cigarettes   Smokeless tobacco: Not on file  Substance and Sexual Activity   Alcohol use: No   Drug use: Not on file   Sexual activity: Not on file  Other Topics Concern   Not on file  Social History Narrative   Not on file   Social Drivers of Health   Financial Resource Strain: Low Risk  (02/04/2024)   [facility] from [facility]   Overall Financial Resource Strain  (CARDIA)    Difficulty of Paying Living Expenses: Not very hard  Food Insecurity: No Food Insecurity (04/01/2024)   Hunger Vital Sign    Worried About Running Out of Food in the Last Year: Never true    Ran Out of Food in the Last Year: Never true  Transportation Needs: No Transportation Needs (04/01/2024)   PRAPARE - Administrator, Civil Service (Medical): No    Lack of Transportation (Non-Medical): No  Physical Activity: Inactive (02/04/2024)   Received from [facility]   Exercise Vital Sign    Days of Exercise per Week: 0 days    Minutes of Exercise per Session: 0 min  Stress: No Stress Concern Present (02/04/2024)   Received from [facility] of Occupational Health - Occupational Stress Questionnaire    Feeling of Stress : Only a little  Social Connections: Socially Isolated (04/01/2024)   Social Connection and Isolation Panel [NHANES]    Frequency of Communication with Friends and Family: Once a week    Frequency of Social Gatherings with Friends and Family: Never    Attends Religious Services: Never    Database administrator or Organizations: No    Attends Banker Meetings: Never    Marital Status: Never married  Intimate Partner Violence: Not At Risk (04/01/2024)   Humiliation, Afraid, Rape, and Kick questionnaire  Fear of Current or Ex-Partner: No    Emotionally Abused: No    Physically Abused: No    Sexually Abused: No    History reviewed. No pertinent family history. Allergies  Allergen Reactions   Nsaids    Fish Allergy Nausea And Vomiting   Shrimp Extract Nausea And Vomiting   I? Current Facility-Administered Medications  Medication Dose Route Frequency Provider Last Rate Last Admin   acetaminophen  (TYLENOL ) tablet 650 mg  650 mg Oral Q6H PRN Duncan, Hazel V, MD       Or   acetaminophen  (TYLENOL ) suppository 650 mg  650 mg Rectal Q6H PRN Lanetta Pion, MD       ascorbic acid (VITAMIN C) tablet 500 mg  500 mg Oral  BID Althia Atlas, MD       cyanocobalamin (VITAMIN B12) injection 1,000 mcg  1,000 mcg Intramuscular Q1200 Althia Atlas, MD   1,000 mcg at 04/04/24 1026   Followed by   Cecily Cohen ON 04/10/2024] cyanocobalamin (VITAMIN B12) tablet 1,000 mcg  1,000 mcg Oral Daily Althia Atlas, MD       enoxaparin  (LOVENOX ) injection 40 mg  40 mg Subcutaneous Q24H Sreeram, Narendranath, MD   40 mg at 04/03/24 2100   feeding supplement (ENSURE ENLIVE / ENSURE PLUS) liquid 237 mL  237 mL Oral BID BM Aisha Hove, MD   237 mL at 04/04/24 1623   fenofibrate  tablet 54 mg  54 mg Oral Daily Duncan, Hazel V, MD   54 mg at 04/04/24 1026   fesoterodine  (TOVIAZ ) tablet 4 mg  4 mg Oral Daily Duncan, Hazel V, MD   4 mg at 04/04/24 1026   furosemide (LASIX) injection 20 mg  20 mg Intravenous BID Althia Atlas, MD   20 mg at 04/04/24 1807   HYDROcodone -acetaminophen  (NORCO/VICODIN) 5-325 MG per tablet 1-2 tablet  1-2 tablet Oral Q4H PRN Duncan, Hazel V, MD       iron polysaccharides (NIFEREX) capsule 150 mg  150 mg Oral Daily Althia Atlas, MD   150 mg at 04/04/24 1026   morphine  (PF) 2 MG/ML injection 2 mg  2 mg Intravenous Q4H PRN Sreeram, Narendranath, MD       multivitamin with minerals tablet 1 tablet  1 tablet Oral Daily Althia Atlas, MD   1 tablet at 04/04/24 1623   ondansetron  (ZOFRAN ) tablet 4 mg  4 mg Oral Q6H PRN Duncan, Hazel V, MD       Or   ondansetron  (ZOFRAN ) injection 4 mg  4 mg Intravenous Q6H PRN Duncan, Hazel V, MD       piperacillin -tazobactam (ZOSYN ) IVPB 3.375 g  3.375 g Intravenous Q8H Madelynn Schilder, RPH 12.5 mL/hr at 04/04/24 1208 3.375 g at 04/04/24 1208   vancomycin  (VANCOREADY) IVPB 750 mg/150 mL  750 mg Intravenous Q24H Nazari, Walid A, RPH 150 mL/hr at 04/04/24 1623 750 mg at 04/04/24 1623     Abtx:  Anti-infectives (From admission, onward)    Start     Dose/Rate Route Frequency Ordered Stop   04/03/24 1500  vancomycin  (VANCOREADY) IVPB 750 mg/150 mL        750 mg 150 mL/hr over  60 Minutes Intravenous Every 24 hours 04/03/24 1339     04/02/24 0900  vancomycin  (VANCOCIN ) IVPB 1000 mg/200 mL premix        1,000 mg 200 mL/hr over 60 Minutes Intravenous  Once 04/02/24 0813 04/02/24 1021   04/01/24 0400  piperacillin -tazobactam (ZOSYN ) IVPB 3.375 g  3.375 g 12.5 mL/hr over 240 Minutes Intravenous Every 8 hours 03/31/24 2044     03/31/24 2046  vancomycin  variable dose per unstable renal function (pharmacist dosing)  Status:  Discontinued         Does not apply See admin instructions 03/31/24 2047 04/03/24 1339   03/31/24 2030  vancomycin  (VANCOCIN ) 1,250 mg in sodium chloride  0.9 % 250 mL IVPB        1,250 mg 175 mL/hr over 90 Minutes Intravenous  Once 03/31/24 1941 03/31/24 2238   03/31/24 1845  piperacillin -tazobactam (ZOSYN ) IVPB 3.375 g        3.375 g 12.5 mL/hr over 240 Minutes Intravenous  Once 03/31/24 1837 03/31/24 2106       REVIEW OF SYSTEMS:  Const: negative fever, negative chills, negative weight loss Eyes: negative diplopia or visual changes, negative eye pain ENT: negative coryza, negative sore throat Resp: negative cough, hemoptysis, dyspnea Cards: negative for chest pain, palpitations, lower extremity edema GU: negative for frequency, dysuria and hematuria GI: Negative for abdominal pain, diarrhea, bleeding, constipation Skin: negative for rash and pruritus Heme: negative for easy bruising and gum/nose bleeding MS:  weakness Neurolo: Paraparesis Neurogenic bladder Psych:  anxiety, depression  Endocrine: negative for thyroid, diabetes Allergy/Immunology-NSAID Objective:  VITALS:  BP 128/87 (BP Location: Right Arm)   Pulse (!) 109   Temp 97.6 F (36.4 C)   Resp 18   Ht 4\' 10"  (1.473 m)   Wt 56.7 kg   SpO2 94%   BMI 26.13 kg/m  LDA Suprapubic catheter PHYSICAL EXAM:  General: Alert, cooperative, no distress, appears stated age.  Achondroplasia Head: Normocephalic, without obvious abnormality, atraumatic. Eyes: Conjunctivae  clear, anicteric sclerae. Pupils are equal ENT Nares normal. No drainage or sinus tenderness. Lips, mucosa, and tongue normal.  Poor dentition  neck: , symmetrical, no adenopathy, thyroid: non tender no carotid bruit and no JVD. Back: Large spinal scar extending along the thoracic to lumbar area  3 pressure ulcers present 1 on the sacrum and 2 on the buttocks at the ischial tuberosity site   .  They will have granulation tissue with some slough in it. Lungs: Bilateral air entry Heart: Regular rate and rhythm, no murmur, rub or gallop. Abdomen: Soft,  Extremities: Right leg some erythema, swelling much better compared to the picture that was taken on admission  Skin: As above Lymph: Cervical, supraclavicular normal. Neurologic: Grossly non-focal Pertinent Labs Lab Results CBC    Component Value Date/Time   WBC 5.7 04/04/2024 1048   RBC 3.30 (L) 04/04/2024 1048   HGB 9.7 (L) 04/04/2024 1048   HGB 14.8 09/25/2013 0432   HCT 30.6 (L) 04/04/2024 1048   HCT 41.7 09/25/2013 0432   PLT 426 (H) 04/04/2024 1048   PLT 344 09/25/2013 0432   MCV 92.7 04/04/2024 1048   MCV 87 09/25/2013 0432   MCH 29.4 04/04/2024 1048   MCHC 31.7 04/04/2024 1048   RDW 15.2 04/04/2024 1048   RDW 12.5 09/25/2013 0432   LYMPHSABS 0.9 03/31/2024 1610   LYMPHSABS 1.0 09/25/2013 0432   MONOABS 0.5 03/31/2024 1610   MONOABS 0.8 09/25/2013 0432   EOSABS 0.0 03/31/2024 1610   EOSABS 0.3 09/25/2013 0432   BASOSABS 0.0 03/31/2024 1610   BASOSABS 0.0 09/25/2013 0432       Latest Ref Rng & Units 04/04/2024   10:48 AM 04/03/2024   12:49 PM 04/03/2024    3:16 AM  CMP  Glucose 70 - 99 mg/dL 696   295  BUN 6 - 20 mg/dL 20   21   Creatinine 9.56 - 1.24 mg/dL 2.13  0.86  5.78   Sodium 135 - 145 mmol/L 137   135   Potassium 3.5 - 5.1 mmol/L 4.0   3.6   Chloride 98 - 111 mmol/L 100   104   CO2 22 - 32 mmol/L 26   22   Calcium 8.9 - 10.3 mg/dL 9.2   8.9       Microbiology: Recent Results (from the past  240 hours)  Urine Culture     Status: Abnormal   Collection Time: 03/31/24  3:36 PM   Specimen: Urine, Random  Result Value Ref Range Status   Specimen Description   Final    URINE, RANDOM Performed at [facility], [address], [place], [place] [address]    Special Requests   Final    NONE Reflexed from [phone] Performed at [facility], [address] [place], [place] [address]    Culture MULTIPLE SPECIES PRESENT, SUGGEST RECOLLECTION (A)  Final   Report Status 04/01/2024 FINAL  Final  Blood culture (routine x 2)     Status: None (Preliminary result)   Collection Time: 03/31/24  7:36 PM   Specimen: BLOOD  Result Value Ref Range Status   Specimen Description BLOOD BLOOD LEFT HAND  Final   Special Requests   Final    BOTTLES DRAWN AEROBIC AND ANAEROBIC Blood Culture results may not be optimal due to an inadequate volume of blood received in culture bottles   Culture   Final    NO GROWTH 4 DAYS Performed at [facility], [address], [place], [place] [address]    Report Status PENDING  Incomplete  Blood culture (routine x 2)     Status: None (Preliminary result)   Collection Time: 03/31/24  7:39 PM   Specimen: BLOOD  Result Value Ref Range Status   Specimen Description BLOOD RIGHT ANTECUBITAL  Final   Special Requests   Final    BOTTLES DRAWN AEROBIC AND ANAEROBIC Blood Culture adequate volume   Culture   Final    NO GROWTH 4 DAYS Performed at [facility], [address] [place], [place] [address]    Report Status PENDING  Incomplete    IMAGING RESULTS: Chest x-ray no infiltrate I have personally reviewed the films ? Impression/Recommendation Right leg cellulitis Patient is currently on Vanco and cefepime Changed to cefazolin It is looking much better He may be able to transition to p.o. medications for a total of 7 days.  Sacral and ischial decubitus ulcers present for a long time Very likely stage IV This is  chronic and does not look overtly infected even though there is some amount of fibrous tissue and slough in it Topical care should be sufficient currently Need to offload the pressure Air mattress If he sitting in a wheelchair it has to be padded  I would avoid culturing these wounds as they can be colonized with various organisms  Achondroplasia CKD  Anemia  ? I have personally spent  -60--minutes involved in face-to-face and non-face-to-face activities for this patient on the day of the visit. Professional time spent includes the following activities: Preparing to see the patient (review of tests), Obtaining and/or reviewing separately obtained history (admission/discharge record), Performing a medically appropriate examination and/or evaluation , Ordering medications/tests/procedures, referring and communicating with other health care professionals, Documenting clinical information in the EMR, Independently interpreting results (not separately reported), Communicating  results to the patient/family/caregiver, Counseling and educating the patient/family/caregiver and Care coordination (not separately reported).    ________________________________________________ Discussed with patient, requesting provider Note:  This document was prepared using Dragon voice recognition software and may include unintentional dictation errors.

## 2024-04-04 NOTE — Progress Notes (Signed)
 Upon arrival Mitchell [name] was lying in bed watching television. Reported no pain.  Mitchell [name] was present on table.  He did not understand previous conversation regarding Mitchell [name] and reports he lives in a nursing home. Does not have someone he would name on Mitchell [name].  Additional education offered.     04/04/24 1600  Spiritual Encounters  Type of Visit Initial  Care provided to: Patient  Reason for visit Advance directives  OnCall Visit Yes

## 2024-04-04 NOTE — Progress Notes (Signed)
 Initial Nutrition Assessment  DOCUMENTATION CODES:   Not applicable  INTERVENTION:   -Liberalize diet to regular for widest variety of meal selections -MVI with minerals daily -500 mg vitamin C BID -200 mg zinc sulfate daily x 14 days -Continue Ensure Enlive po BID, each supplement provides 350 kcal and 20 grams of protein.  -RD will check labs (vitamin A) for potential micronutrient deficiencies related to wound healing   NUTRITION DIAGNOSIS:   Increased nutrient needs related to wound healing as evidenced by estimated needs.  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  Pt admitted with rt leg cellulitis and infected stage IV pressure injuries to sacrum and bilateral buttocks with osteomyelitis, and SIRS.   5/9- s/p bedside debridement by general surgery  Reviewed I/O's: -2.6 L x 24 hours  UOP: 2.6 L x 24 hours  Per general surgery notes, no further surgical needs noted.   Spoke with pt at bedside, who was pleasant and in good spirits today. Pt reports he consumed most of his breakfast (oatmeal and eggs) for breakfast this morning. He was sipping on Ensure today and likes this, but reports he is not hungry right now as he is full from breakfast. PTA, he  resided at [facility] and reports no special diet restrictions. Pt generally consumes 3 meals per day and eats most of his meals. Noted meal completions 75-100%.   Pt denies any weight loss. Reviewed weight history and CareEverywhere; noted pt was 164# on 02/04/24, which is a 24% weight loss over the past 2 months, significant for time frame. Noted pt with moderate edema, which is likely masking further weight loss as well as fat and muscle depletions.   Discussed importance of good meal and supplement intake to promote healing. Pt amenable to continue Ensure.   Per MD notes, pt will discharge to SNF, likely tomorrow.   Medications reviewed and include vitamin B-12 and lasix.   Palliative care following for goals of care. Pt desires to be full code.   Labs reviewed.    NUTRITION - FOCUSED PHYSICAL EXAM:  Flowsheet Row Most Recent Value  Orbital Region No depletion  Upper Arm Region No depletion  Thoracic and Lumbar Region No depletion  Buccal Region No depletion  Temple Region Mild depletion  Clavicle Bone Region No depletion  Clavicle and Acromion Bone Region No depletion  Scapular Bone Region No depletion  Dorsal Hand No depletion  Patellar Region No depletion  Anterior Thigh Region No depletion  Posterior Calf Region No depletion  Edema (RD Assessment) Moderate  Hair Reviewed  Eyes Reviewed  Mouth Reviewed  Skin Reviewed  Nails Reviewed       Diet Order:   Diet Order             Diet regular Fluid consistency: Thin  Diet effective now                   EDUCATION NEEDS:   Education needs have been addressed  Skin:  Skin Assessment: Skin Integrity Issues: Skin Integrity Issues:: Stage IV Stage IV: sacrum and bilateral buttocks  Last BM:  04/01/24  Height:   Ht Readings from Last 1 Encounters:  03/31/24 4\' 10"  (1.473 m)    Weight:   Wt Readings from Last 1 Encounters:  03/31/24 56.7 kg    Ideal Body Weight:  43.9 kg  BMI:  Body mass index is 26.13  kg/m.  Estimated Nutritional Needs:   Kcal:  1700-1900  Protein:  90-105 grams  Fluid:  1.7-1.9 L    Herschel Lords, RD, LDN, CDCES Registered Dietitian III Certified Diabetes Care and Education Specialist If unable to reach this RD, please use "RD Inpatient" group chat on secure chat between hours of 8am-4 pm daily

## 2024-04-04 NOTE — Progress Notes (Signed)
 Palliative Care Progress Note, Assessment & Plan   Patient Name: Mitchell [name]       Date: 04/04/2024 DOB: 08-04-1971  Age: 53 y.o. MRN#: [id number] Attending Physician: Althia Atlas, MD Primary Care Physician: Darylene Epley, MD Admit Date: 03/31/2024  Subjective: Pt resting in bed. Denies pain. Slept off and on last night. Denies CP/SOB. Ate breakfast this morning. Wants to know when he can return to his facility.   HPI: 53 y.o. 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 He is followed at the wound care center after prior hospitalizations at [facility], 3/13-3/19/25 with Proteus bacteremia of urinary source. He was sent to ED by wound care center for IV antibiotics due to suspected chronic stage IV sacral wounds.    ED workup found patient to have swelling and redness to R leg, low-grade fever and tachypnea. ED labs revealed BUN 31, Creatinine 2.30, Ca+ 8.01, Albumin 2.6, Hgb 9.8, Hct 31.6, CXR negative.  Patient was admitted for further assessment and management of infected decubitus ulcer, stage IV, sacral osteomyelitis, cellulitis of right leg and ARF superimposed in stage 3a CKD.    Palliative consulted for goals of care discussion.     Summary of counseling/coordination of care: Extensive chart review completed prior to meeting patient including labs, vital signs, imaging, progress notes, orders, and available advanced directive documents from current and previous encounters.   After reviewing the patient's chart and assessing the patient at bedside, I spoke with patient in  regards to symptom management and goals of care.   53 yo male resting in bed. He is A&O, calm and pleasant. Even, unlabored respirations. He is in no distress.   Patient was provided with living will documentation. Discussed with his chronic wounds and high risk for recurrent infections, it is important to complete documents, making his wishes know. In the event he is not able to speak for himself, living will would outline the care he wants to receive. Patient is agreeable for spiritual care to assist with completion of living will.   Spiritual consult placed for completion of documents.   Therapeutic silence and active listening provided for patient to share his thoughts and emotions regarding current medical situation.  Emotional support provided.  Physical Exam Vitals reviewed.  Constitutional:      General: He is not in acute distress.    Appearance: He is not ill-appearing.  HENT:     Head: Atraumatic.     Mouth/Throat:     Mouth: Mucous membranes are moist.  Pulmonary:     Effort: Pulmonary effort is normal. No respiratory distress.  Musculoskeletal:     Right lower leg: No edema.     Left lower leg: No edema.  Skin:    General: Skin is warm and dry.     Coloration: Skin is not pale.  Neurological:     Mental Status: He is alert and oriented to person, place, and time.  Psychiatric:        Mood and Affect: Mood normal.  Behavior: Behavior normal.        Thought Content: Thought content normal.        Judgment: Judgment normal.   Recommendations/Plan: FULL CODE status as previously documented    Continue current supportive interventions Living will paperwork to be completed with spiritual care Palliative to follow peripherally for needs   Total Time 25 minutes   Time spent includes: Detailed review of medical records (labs, imaging, vital signs), medically appropriate exam (mental status, respiratory, cardiac, skin), discussed with treatment team, counseling and  educating patient, family and staff, documenting clinical information, medication management and coordination of care.     Ina Manas, Joyice Nodal- [facility] Palliative Medicine Team  04/04/2024 11:44 AM  Office [phone]  Pager [phone]

## 2024-04-05 ENCOUNTER — Inpatient Hospital Stay (HOSPITAL_COMMUNITY)
Admit: 2024-04-05 | Discharge: 2024-04-05 | Disposition: A | Discharge status: Home / Self Care | Attending: Student | Admitting: Student

## 2024-04-05 DIAGNOSIS — I96 Gangrene, not elsewhere classified: Secondary | ICD-10-CM | POA: Diagnosis not present

## 2024-04-05 DIAGNOSIS — L89304 Pressure ulcer of unspecified buttock, stage 4: Secondary | ICD-10-CM | POA: Diagnosis not present

## 2024-04-05 DIAGNOSIS — L8994 Pressure ulcer of unspecified site, stage 4: Secondary | ICD-10-CM | POA: Diagnosis not present

## 2024-04-05 DIAGNOSIS — L03115 Cellulitis of right lower limb: Secondary | ICD-10-CM | POA: Diagnosis not present

## 2024-04-05 DIAGNOSIS — I5021 Acute systolic (congestive) heart failure: Secondary | ICD-10-CM

## 2024-04-05 DIAGNOSIS — L089 Local infection of the skin and subcutaneous tissue, unspecified: Secondary | ICD-10-CM | POA: Diagnosis not present

## 2024-04-05 LAB — ECHOCARDIOGRAM COMPLETE
AR max vel: 2.41 cm2
AV Area VTI: 2.19 cm2
AV Area mean vel: 2.34 cm2
AV Mean grad: 2 mmHg
AV Peak grad: 4.2 mmHg
Ao pk vel: 1.02 m/s
Area-P 1/2: 7.66 cm2
Height: 58 in
S' Lateral: 2 cm
Weight: 2000 [oz_av]

## 2024-04-05 LAB — CBC
HCT: 30.6 % — ABNORMAL LOW (ref 39.0–52.0)
Hemoglobin: 9.8 g/dL — ABNORMAL LOW (ref 13.0–17.0)
MCH: 29.6 pg (ref 26.0–34.0)
MCHC: 32 g/dL (ref 30.0–36.0)
MCV: 92.4 fL (ref 80.0–100.0)
Platelets: 451 10*3/uL — ABNORMAL HIGH (ref 150–400)
RBC: 3.31 MIL/uL — ABNORMAL LOW (ref 4.22–5.81)
RDW: 15 % (ref 11.5–15.5)
WBC: 8.4 10*3/uL (ref 4.0–10.5)
nRBC: 0 % (ref 0.0–0.2)

## 2024-04-05 LAB — CULTURE, BLOOD (ROUTINE X 2)
Culture: NO GROWTH
Culture: NO GROWTH
Special Requests: ADEQUATE

## 2024-04-05 LAB — BASIC METABOLIC PANEL WITH GFR
Anion gap: 12 (ref 5–15)
BUN: 25 mg/dL — ABNORMAL HIGH (ref 6–20)
CO2: 27 mmol/L (ref 22–32)
Calcium: 9.3 mg/dL (ref 8.9–10.3)
Chloride: 96 mmol/L — ABNORMAL LOW (ref 98–111)
Creatinine, Ser: 1.59 mg/dL — ABNORMAL HIGH (ref 0.61–1.24)
GFR, Estimated: 53 mL/min — ABNORMAL LOW (ref >60)
Glucose, Bld: 111 mg/dL — ABNORMAL HIGH (ref 70–99)
Potassium: 3.2 mmol/L — ABNORMAL LOW (ref 3.5–5.1)
Sodium: 135 mmol/L (ref 135–145)

## 2024-04-05 LAB — PHOSPHORUS: Phosphorus: 4.9 mg/dL — ABNORMAL HIGH (ref 2.5–4.6)

## 2024-04-05 LAB — MAGNESIUM: Magnesium: 1.9 mg/dL (ref 1.7–2.4)

## 2024-04-05 MED ORDER — CEFAZOLIN SODIUM-DEXTROSE 1-4 GM/50ML-% IV SOLN
1.0000 g | Freq: Three times a day (TID) | INTRAVENOUS | Status: DC
  Administered 2024-04-05 – 2024-04-06 (×2): 1 g via INTRAVENOUS
  Filled 2024-04-05 (×4): qty 50

## 2024-04-05 MED ORDER — POTASSIUM CHLORIDE CRYS ER 20 MEQ PO TBCR
40.0000 meq | EXTENDED_RELEASE_TABLET | ORAL | Status: AC
  Administered 2024-04-05 (×2): 40 meq via ORAL
  Filled 2024-04-05 (×2): qty 2

## 2024-04-05 MED ORDER — POTASSIUM CHLORIDE 10 MEQ/100ML IV SOLN
10.0000 meq | INTRAVENOUS | Status: DC
  Filled 2024-04-05 (×4): qty 100

## 2024-04-05 MED ORDER — METOPROLOL TARTRATE 25 MG PO TABS
25.0000 mg | ORAL_TABLET | Freq: Two times a day (BID) | ORAL | Status: DC
  Administered 2024-04-05 – 2024-04-06 (×2): 25 mg via ORAL
  Filled 2024-04-05 (×2): qty 1

## 2024-04-05 NOTE — NC FL2 (Signed)
 [place]  MEDICAID FL2 LEVEL OF CARE FORM     IDENTIFICATION  Patient Name: Mitchell [name] Birthdate: 20-Jun-1971 Sex: male Admission Date (Current Location): 03/31/2024  [facility] and [place] Number:  Chiropodist and Address:  [facility], [address], [place], [place] [address]      Provider Number: [id number]  Attending Physician Name and Address:  Althia Atlas, MD  Relative Name and Phone Number:  Patient refused to give    Current Level of Care: Hospital Recommended Level of Care: Skilled Nursing Facility Prior Approval Number:    Date Approved/Denied:   PASRR Number:    Discharge Plan: SNF    Current Diagnoses: Patient Active Problem List   Diagnosis Date Noted   Cellulitis of right leg 04/01/2024   Pressure injury of right buttock, stage 3 (HCC) 04/01/2024   Pressure injury of left buttock, stage 3 (HCC) 04/01/2024   Sacral osteomyelitis (HCC) 04/01/2024   Cellulitis 03/31/2024   Acute renal failure superimposed on stage 3a chronic kidney disease (HCC) 03/31/2024   Infected decubitus ulcer, stage IV (HCC) 03/31/2024   HTN (hypertension) 03/31/2024   Frequent UTI 03/31/2024   SIRS (systemic inflammatory response syndrome) (HCC) 03/31/2024   Obstructive sleep apnea 12/20/2021   Suprapubic catheter secondary to neurogenic bladder and urethral stricture (HCC) 03/21/2021   Hyperlipidemia 03/21/2021   Depression 03/21/2021   Chronic anemia 03/21/2021   Resides in long term care facility 01/07/2020   Spondylosis with myelopathy 12/02/2018   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   Spinal cord compression due to degenerative disorder of spinal column 12/02/2018   Chondrodystrophy 11/06/2018    Orientation RESPIRATION BLADDER Height & Weight     Self, Time, Situation, Place  Normal External catheter, Incontinent Weight: 56.7 kg Height:  4\' 10"  (147.3 cm)  BEHAVIORAL SYMPTOMS/MOOD NEUROLOGICAL BOWEL NUTRITION  STATUS  Other (Comment) (n/a)   Continent Diet (Regular)  AMBULATORY STATUS COMMUNICATION OF NEEDS Skin   Extensive Assist Verbally PU Stage and Appropriate Care (Full thickness to right buttocks and sacrum)       PU Stage 4 Dressing: BID               Personal Care Assistance Level of Assistance  Bathing, Feeding Bathing Assistance: Limited assistance Feeding assistance: Independent Dressing Assistance: Limited assistance     Functional Limitations Info  Sight, Hearing Sight Info: Adequate Hearing Info: Adequate      SPECIAL CARE FACTORS FREQUENCY                       Contractures Contractures Info: Not present    Additional Factors Info  Code Status, Allergies Code Status Info: Full Allergies Info: Nsaids, Fish Allergy, Shrimp Extract           Current Medications (04/05/2024):  This is the current hospital active medication list Current Facility-Administered Medications  Medication Dose Route Frequency Provider Last Rate Last Admin   acetaminophen  (TYLENOL ) tablet 650 mg  650 mg Oral Q6H PRN Duncan, Hazel V, MD       Or   acetaminophen  (TYLENOL ) suppository 650 mg  650 mg Rectal Q6H PRN Lanetta Pion, MD       ascorbic acid (VITAMIN C) tablet 500 mg  500 mg Oral BID Althia Atlas, MD   500 mg at 04/05/24 9562   ceFAZolin (ANCEF) IVPB 1 g/50 mL premix  1 g Intravenous Q8H Althia Atlas, MD       cyanocobalamin (  VITAMIN B12) injection 1,000 mcg  1,000 mcg Intramuscular Q1200 Althia Atlas, MD   1,000 mcg at 04/04/24 1026   Followed by   Cecily Cohen ON 04/10/2024] cyanocobalamin (VITAMIN B12) tablet 1,000 mcg  1,000 mcg Oral Daily Althia Atlas, MD       enoxaparin  (LOVENOX ) injection 40 mg  40 mg Subcutaneous Q24H Sreeram, Narendranath, MD   40 mg at 04/04/24 2129   feeding supplement (ENSURE ENLIVE / ENSURE PLUS) liquid 237 mL  237 mL Oral BID BM Aisha Hove, MD   237 mL at 04/04/24 1623   fenofibrate  tablet 54 mg  54 mg Oral Daily Duncan, Hazel V,  MD   54 mg at 04/05/24 1610   fesoterodine  (TOVIAZ ) tablet 4 mg  4 mg Oral Daily Duncan, Hazel V, MD   4 mg at 04/05/24 9604   furosemide (LASIX) injection 20 mg  20 mg Intravenous BID Althia Atlas, MD   20 mg at 04/05/24 5409   HYDROcodone -acetaminophen  (NORCO/VICODIN) 5-325 MG per tablet 1-2 tablet  1-2 tablet Oral Q4H PRN Duncan, Hazel V, MD       iron polysaccharides (NIFEREX) capsule 150 mg  150 mg Oral Daily Althia Atlas, MD   150 mg at 04/05/24 8119   morphine  (PF) 2 MG/ML injection 2 mg  2 mg Intravenous Q4H PRN Sreeram, Narendranath, MD       multivitamin with minerals tablet 1 tablet  1 tablet Oral Daily Althia Atlas, MD   1 tablet at 04/05/24 1478   ondansetron  (ZOFRAN ) tablet 4 mg  4 mg Oral Q6H PRN Lanetta Pion, MD       Or   ondansetron  (ZOFRAN ) injection 4 mg  4 mg Intravenous Q6H PRN Duncan, Hazel V, MD       potassium chloride SA (KLOR-CON M) CR tablet 40 mEq  40 mEq Oral Q4H Althia Atlas, MD         Discharge Medications: Please see discharge summary for a list of discharge medications.  Relevant Imaging Results:  Relevant Lab Results:   Additional Information [id number]  Makyla Bye C Kiaya Haliburton, RN

## 2024-04-05 NOTE — TOC Progression Note (Signed)
 Transition of Care [facility]) - Progression Note    Patient Details  Name: Mitchell [name] MRN: [id number] Date of Birth: [birth year]  Transition of Care [facility]) CM/SW Contact  Baird Bombard, RN Phone Number: 04/05/2024, 12:54 PM  Clinical Narrative:    [name] with Jade from [facility]. Patient is lTC resident and can return at discharge.          Expected Discharge Plan and Services                                               Social Determinants of Health (SDOH) Interventions SDOH Screenings   Food Insecurity: No Food Insecurity (04/01/2024)  Housing: Low Risk  (04/01/2024)  Transportation Needs: No Transportation Needs (04/01/2024)  Utilities: Not At Risk (04/01/2024)  Financial Resource Strain: Low Risk  (02/04/2024)   Received from [facility]  Physical Activity: Inactive (02/04/2024)   Received from [facility]  Social Connections: Socially Isolated (04/01/2024)  Stress: No Stress Concern Present (02/04/2024)   Received from [facility]  Tobacco Use: High Risk (03/31/2024)  Health Literacy: Low Risk  (02/04/2024)   Received from [facility]    Readmission Risk Interventions     No data to display

## 2024-04-05 NOTE — Progress Notes (Signed)
*  PRELIMINARY RESULTS* Echocardiogram 2D Echocardiogram has been performed.  Mitchell [name] 04/05/2024, 7:57 AM

## 2024-04-05 NOTE — Plan of Care (Signed)

## 2024-04-05 NOTE — Progress Notes (Signed)
 Date of Admission:  03/31/2024   Total days of antibiotics   ID: Mitchell [name] is a 53 y.o. male with  *** Principal Problem:   Infected decubitus ulcer, stage IV (HCC) Active Problems:   Cellulitis   Suprapubic catheter secondary to neurogenic bladder and urethral stricture (HCC)   Spinal cord compression due to degenerative disorder of spinal column   Chondrodystrophy   Chronic anemia   Acute renal failure superimposed on stage 3a chronic kidney disease (HCC)   HTN (hypertension)   Frequent UTI   SIRS (systemic inflammatory response syndrome) (HCC)   Cellulitis of right leg   Pressure injury of right buttock, stage 3 (HCC)   Pressure injury of left buttock, stage 3 (HCC)   Sacral osteomyelitis (HCC)    Subjective: ***  Medications:   vitamin C  500 mg Oral BID   cyanocobalamin  1,000 mcg Intramuscular Q1200   Followed by   Cecily Cohen ON 04/10/2024] vitamin B-12  1,000 mcg Oral Daily   enoxaparin  (LOVENOX ) injection  40 mg Subcutaneous Q24H   feeding supplement  237 mL Oral BID BM   fenofibrate   54 mg Oral Daily   fesoterodine   4 mg Oral Daily   furosemide  20 mg Intravenous BID   iron polysaccharides  150 mg Oral Daily   multivitamin with minerals  1 tablet Oral Daily   potassium chloride  40 mEq Oral Q4H    Objective: Vital signs in last 24 hours: Patient Vitals for the past 24 hrs:  BP Temp Temp src Pulse Resp SpO2  04/05/24 1140 130/75 98.1 F (36.7 C) -- (!) 115 -- 96 %  04/05/24 0720 (!) 141/89 97.9 F (36.6 C) -- (!) 124 -- 97 %  04/05/24 0400 104/71 98.1 F (36.7 C) Oral (!) 122 -- 98 %  04/04/24 2328 (!) 138/94 97.6 F (36.4 C) Oral (!) 118 16 98 %  04/04/24 2000 129/88 98.8 F (37.1 C) Oral (!) 120 17 100 %  04/04/24 1637 128/87 97.6 F (36.4 C) -- (!) 109 18 94 %     LDA Foley Central lines Other catheters  PHYSICAL EXAM:  General: Alert, cooperative, no distress, appears stated age.  Head: Normocephalic, without obvious abnormality,  atraumatic. Eyes: Conjunctivae clear, anicteric sclerae. Pupils are equal ENT Nares normal. No drainage or sinus tenderness. Lips, mucosa, and tongue normal. No Thrush Neck: Supple, symmetrical, no adenopathy, thyroid: non tender no carotid bruit and no JVD. Back: No CVA tenderness. Lungs: Clear to auscultation bilaterally. No Wheezing or Rhonchi. No rales. Heart: Regular rate and rhythm, no murmur, rub or gallop. Abdomen: Soft, non-tender,not distended. Bowel sounds normal. No masses Extremities: atraumatic, no cyanosis. No edema. No clubbing Skin: No rashes or lesions. Or bruising Lymph: Cervical, supraclavicular normal. Neurologic: Grossly non-focal  Lab Results    Latest Ref Rng & Units 04/05/2024    4:22 AM 04/04/2024   10:48 AM 04/03/2024    3:16 AM  CBC  WBC 4.0 - 10.5 K/uL 8.4  5.7  5.5   Hemoglobin 13.0 - 17.0 g/dL 9.8  9.7  8.9   Hematocrit 39.0 - 52.0 % 30.6  30.6  28.2   Platelets 150 - 400 K/uL 451  426  388        Latest Ref Rng & Units 04/05/2024    4:22 AM 04/04/2024   10:48 AM 04/03/2024   12:49 PM  CMP  Glucose 70 - 99 mg/dL 161  096    BUN 6 -  20 mg/dL 25  20    Creatinine 4.54 - 1.24 mg/dL 0.98  1.19  1.47   Sodium 135 - 145 mmol/L 135  137    Potassium 3.5 - 5.1 mmol/L 3.2  4.0    Chloride 98 - 111 mmol/L 96  100    CO2 22 - 32 mmol/L 27  26    Calcium 8.9 - 10.3 mg/dL 9.3  9.2        Microbiology:  Studies/Results: ECHOCARDIOGRAM COMPLETE Result Date: 04/05/2024    ECHOCARDIOGRAM REPORT   Patient Name:   Mitchell [name] Date of Exam: 04/05/2024 Medical Rec #:  [id number]      Height:       58.0 in Accession #:    [id number]     Weight:       125.0 lb Date of Birth:  10/15/1971       BSA:          1.491 m Patient Age:    52 years       BP:           104/71 mmHg Patient Gender: M              HR:           122 bpm. Exam Location:  [facility] Procedure: 2D Echo, Cardiac Doppler, Color Doppler and Saline Contrast Bubble            Study (Both Spectral and Color Flow  Doppler were utilized during            procedure). Indications:     CHF--acute systolic I50.21  History:         Patient has no prior history of Echocardiogram examinations.                  Risk Factors:Hypertension.  Sonographer:     Broadus Canes Referring Phys:  [id number] [facility] Diagnosing Phys: Sabina Custovic IMPRESSIONS  1. Left ventricular ejection fraction, by estimation, is 60 to 65%. The left ventricle has normal function. The left ventricle has no regional wall motion abnormalities. Left ventricular diastolic parameters were normal.  2. Right ventricular systolic function is normal. The right ventricular size is normal.  3. The mitral valve is normal in structure. No evidence of mitral valve regurgitation. No evidence of mitral stenosis.  4. The aortic valve is normal in structure. Aortic valve regurgitation is not visualized. No aortic stenosis is present.  5. The inferior vena cava is normal in size with greater than 50% respiratory variability, suggesting right atrial pressure of 3 mmHg. FINDINGS  Left Ventricle: Left ventricular ejection fraction, by estimation, is 60 to 65%. The left ventricle has normal function. The left ventricle has no regional wall motion abnormalities. The left ventricular internal cavity size was normal in size. There is  no left ventricular hypertrophy. Left ventricular diastolic parameters were normal. Right Ventricle: The right ventricular size is normal. No increase in right ventricular wall thickness. Right ventricular systolic function is normal. Left Atrium: Left atrial size was normal in size. Right Atrium: Right atrial size was normal in size. Pericardium: There is no evidence of pericardial effusion. Mitral Valve: The mitral valve is normal in structure. No evidence of mitral valve regurgitation. No evidence of mitral valve stenosis. Tricuspid Valve: The tricuspid valve is normal in structure. Tricuspid valve regurgitation is trivial. Aortic Valve: The aortic  valve is normal in structure. Aortic valve regurgitation is not visualized. No aortic stenosis is present. Aortic  valve mean gradient measures 2.0 mmHg. Aortic valve peak gradient measures 4.2 mmHg. Aortic valve area, by VTI measures 2.19 cm. Pulmonic Valve: The pulmonic valve was normal in structure. Pulmonic valve regurgitation is not visualized. Aorta: The aortic root is normal in size and structure. Venous: The inferior vena cava is normal in size with greater than 50% respiratory variability, suggesting right atrial pressure of 3 mmHg. IAS/Shunts: No atrial level shunt detected by color flow Doppler. Agitated saline contrast was given intravenously to evaluate for intracardiac shunting.  LEFT VENTRICLE PLAX 2D LVIDd:         3.10 cm   Diastology LVIDs:         2.00 cm   LV e' medial:    40.16 cm/s LV PW:         1.00 cm   LV E/e' medial:  1.9 LV IVS:        0.90 cm   LV e' lateral:   7.07 cm/s LVOT diam:     2.00 cm   LV E/e' lateral: 10.7 LV SV:         35 LV SV Index:   23 LVOT Area:     3.14 cm  RIGHT VENTRICLE RV Basal diam:  2.80 cm RV Mid diam:    2.40 cm RV S prime:     21.80 cm/s TAPSE (M-mode): 2.1 cm LEFT ATRIUM           Index        RIGHT ATRIUM           Index LA diam:      2.60 cm 1.74 cm/m   RA Area:     10.90 cm LA Vol (A2C): 23.6 ml 15.82 ml/m  RA Volume:   21.90 ml  14.68 ml/m LA Vol (A4C): 12.1 ml 8.11 ml/m  AORTIC VALVE AV Area (Vmax):    2.41 cm AV Area (Vmean):   2.34 cm AV Area (VTI):     2.19 cm AV Vmax:           102.00 cm/s AV Vmean:          67.800 cm/s AV VTI:            0.159 m AV Peak Grad:      4.2 mmHg AV Mean Grad:      2.0 mmHg LVOT Vmax:         78.20 cm/s LVOT Vmean:        50.400 cm/s LVOT VTI:          0.111 m LVOT/AV VTI ratio: 0.70  AORTA Ao Root diam: 3.30 cm MITRAL VALVE                TRICUSPID VALVE MV Area (PHT): 7.66 cm     TR Peak grad:   7.4 mmHg MV Decel Time: 99 msec      TR Vmax:        136.00 cm/s MV E velocity: 75.50 cm/s MV A velocity: 119.00 cm/s   SHUNTS MV E/A ratio:  0.63         Systemic VTI:  0.11 m                             Systemic Diam: 2.00 cm Lanell Pinta Custovic Electronically signed by Isabell Manzanilla Signature Date/Time: 04/05/2024/10:57:07 AM    Final      Assessment/Plan: Right leg cellulitis Patient is currently on Vanco and  cefepime Changed to cefazolin It is looking much better He may be able to transition to p.o. medications for a total of 7 days.==04/07/24   Sacral and ischial decubitus ulcers present for a long time Very likely stage IV This is chronic and does not look overtly infected even though there is some amount of fibrous tissue and slough in it Topical care should be sufficient currently Need to offload the pressure Air mattress If he sitting in a wheelchair it has to be padded   I would avoid culturing these wounds as they can be colonized with various organisms   Achondroplasia CKD   Anemia

## 2024-04-05 NOTE — Progress Notes (Signed)
 Progress Note   Patient: Mitchell [name] HQI:[id number] DOB: 1971-04-04 DOA: 03/31/2024     5 DOS: the patient was seen and examined on 04/05/2024   Brief hospital course: Mitchell [name] is a 53 y.o. 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   Assessment and Plan:  # Decubitus ulcer, stage IV  # Sacral osteomyelitis (MRI 03/06/24) with adjacent cellulitis # Stage IV decubitus ulcers  sacrum and right and left buttocks Possible sepsis with tachycardia, low BP. Surgery team evaluation appreciated, bedside debridement done 04/01/24. S/p vancomycin  and Zosyn .   Encourage oral fluids, diet. Monitor closely in progressive unit on telemetry. ID consulted, DC'd Zosyn  and vancomycin , started Ancef 1 g IV 3 times daily for cellulitis of right lower extremity.  Recommended to transition to oral antibiotics on discharge for total 7-day course  # Cellulitis of right leg. S/p  Zosyn  and vancomycin  Keep leg elevated. ID consulted, recommended cefazolin IV, changed to oral antibiotics on discharge for total 7 days.  # Bilateral lower extremity edema Venous duplex right lower extremity negative for DVT 5/11 started on Lasix 40 mg IV twice daily TSH level 3.3 wnl, BNP 13 low 5/12 edema improved, patient received Lasix 40 mg dose in the morning, decreased to 20 mg IV twice daily TTE LVEF 60 to 65%, no wall motion abnormality, no any other significant findings. 5/13 continue  Lasix 20 mg IV twice daily  # Hypokalemia secondary to diuresis, potassium repleted. Monitor electrolytes and replete as needed.   # Acute renal failure superimposed on stage 3a chronic kidney disease:  Creatinine improved s/p fluids. Continue to monitor daily renal function Avoid nephrotoxins.  Suprapubic catheter secondary to neurogenic bladder and urethral stricture History of frequent UTIs History of Proteus bacteremia secondary to UTI 01/2024 Last suprapubic catheter exchanged by IR on 03/29/2024 Urinalysis with moderate leukocyte esterase urine culture: Grew multiple species, suggested recollection. Blood culture NGTD Patient is already on broad-spectrum antibiotics at this time.  Chronic anemia Hemoglobin low at 9.8 but up from 8.9 on 3/18 Iron and B12 level low, will give supplements. Continue ferrous sulfate   HTN (hypertension) Beta-blocker was held due to low blood pressure 5/13 BP stable, patient has tachycardia, resumed Lopressor  25 mg p.o. twice daily home dose Monitor BP and titrate medication accordingly   Spinal cord compression due to degenerative disorder of spinal column, paraplegia. Chondrodystrophy No acute issues Nursing supportive care. He is bed bound.   Vitamin B12 deficiency: Started vitamin B12 1000 mcg IM injection daily during hospital stay, followed by oral supplement.  Follow-up PCP to repeat vitamin B12 level after 3 to 6 months.  Iron deficiency, transferrin saturation 13%, started oral iron supplement with vitamin C.  Follow with PCP repeat iron profile after 3 to 6 months      Out of bed to chair. Incentive spirometry. Nursing supportive care. Fall, aspiration precautions. Diet:  Diet Orders (From admission, onward)     Start     Ordered   04/04/24 1402  Diet regular  Fluid consistency: Thin  Diet effective now       Question:  Fluid consistency:  Answer:  Thin   04/04/24 1401           DVT prophylaxis: enoxaparin  (LOVENOX )  injection 40 mg Start: 04/02/24 2200  Level of care: Progressive   Code Status: Full Code  Subjective: No significant events overnight, patient does not wanted IV potassium so replaced with oral potassium pills. Patient denied any other complaints, agreed to stay overnight and plan for discharge tomorrow a.m.   Physical Exam: Vitals:   04/05/24 0400 04/05/24 0720 04/05/24 1140 04/05/24 1531  BP: 104/71 (!) 141/89 130/75 126/74  Pulse: (!) 122 (!) 124 (!) 115 (!) 120  Resp:      Temp: 98.1 F (36.7 C) 97.9 F (36.6 C) 98.1 F (36.7 C) 97.7 F (36.5 C)  TempSrc: Oral     SpO2: 98% 97% 96% 99%  Weight:      Height:        General - Middle aged Caucasian male, no apparent distress HEENT - PERRLA, EOMI, atraumatic head, non tender sinuses. Lung - Clear, basal rales, rhonchi, no wheezes. Heart - S1, S2 heard, no murmurs, rubs, 2+ pedal edema right>left Abdomen - Soft, non tender obese chronic suprapubic cath. Neuro - Alert, awake and oriented, non focal exam. Skin -Right leg redness, B/L 2-3+ edema, improved as compared to yesterday  Data Reviewed:      Latest Ref Rng & Units 04/05/2024    4:22 AM 04/04/2024   10:48 AM 04/03/2024    3:16 AM  CBC  WBC 4.0 - 10.5 K/uL 8.4  5.7  5.5   Hemoglobin 13.0 - 17.0 g/dL 9.8  9.7  8.9   Hematocrit 39.0 - 52.0 % 30.6  30.6  28.2   Platelets 150 - 400 K/uL 451  426  388       Latest Ref Rng & Units 04/05/2024    4:22 AM 04/04/2024   10:48 AM 04/03/2024   12:49 PM  BMP  Glucose 70 - 99 mg/dL 295  621    BUN 6 - 20 mg/dL 25  20    Creatinine 3.08 - 1.24 mg/dL 6.57  8.46  9.62   Sodium 135 - 145 mmol/L 135  137    Potassium 3.5 - 5.1 mmol/L 3.2  4.0    Chloride 98 - 111 mmol/L 96  100    CO2 22 - 32 mmol/L 27  26    Calcium 8.9 - 10.3 mg/dL 9.3  9.2     ECHOCARDIOGRAM COMPLETE Result Date: 04/05/2024    ECHOCARDIOGRAM REPORT   Patient Name:   Mitchell [name] Date of Exam: 04/05/2024 Medical Rec #:  [id number]      Height:       58.0  in Accession #:    [id number]     Weight:       125.0 lb Date of Birth:  December 21, 1970       BSA:          1.491 m Patient Age:    52 years       BP:           104/71 mmHg Patient Gender: M              HR:           122 bpm. Exam Location:  [facility] Procedure: 2D Echo, Cardiac Doppler, Color Doppler and Saline Contrast Bubble  Study (Both Spectral and Color Flow Doppler were utilized during            procedure). Indications:     CHF--acute systolic I50.21  History:         Patient has no prior history of Echocardiogram examinations.                  Risk Factors:Hypertension.  Sonographer:     Broadus Canes Referring Phys:  [id number] [facility] Diagnosing Phys: Sabina Custovic IMPRESSIONS  1. Left ventricular ejection fraction, by estimation, is 60 to 65%. The left ventricle has normal function. The left ventricle has no regional wall motion abnormalities. Left ventricular diastolic parameters were normal.  2. Right ventricular systolic function is normal. The right ventricular size is normal.  3. The mitral valve is normal in structure. No evidence of mitral valve regurgitation. No evidence of mitral stenosis.  4. The aortic valve is normal in structure. Aortic valve regurgitation is not visualized. No aortic stenosis is present.  5. The inferior vena cava is normal in size with greater than 50% respiratory variability, suggesting right atrial pressure of 3 mmHg. FINDINGS  Left Ventricle: Left ventricular ejection fraction, by estimation, is 60 to 65%. The left ventricle has normal function. The left ventricle has no regional wall motion abnormalities. The left ventricular internal cavity size was normal in size. There is  no left ventricular hypertrophy. Left ventricular diastolic parameters were normal. Right Ventricle: The right ventricular size is normal. No increase in right ventricular wall thickness. Right ventricular systolic function is normal. Left Atrium: Left atrial size was normal in size. Right  Atrium: Right atrial size was normal in size. Pericardium: There is no evidence of pericardial effusion. Mitral Valve: The mitral valve is normal in structure. No evidence of mitral valve regurgitation. No evidence of mitral valve stenosis. Tricuspid Valve: The tricuspid valve is normal in structure. Tricuspid valve regurgitation is trivial. Aortic Valve: The aortic valve is normal in structure. Aortic valve regurgitation is not visualized. No aortic stenosis is present. Aortic valve mean gradient measures 2.0 mmHg. Aortic valve peak gradient measures 4.2 mmHg. Aortic valve area, by VTI measures 2.19 cm. Pulmonic Valve: The pulmonic valve was normal in structure. Pulmonic valve regurgitation is not visualized. Aorta: The aortic root is normal in size and structure. Venous: The inferior vena cava is normal in size with greater than 50% respiratory variability, suggesting right atrial pressure of 3 mmHg. IAS/Shunts: No atrial level shunt detected by color flow Doppler. Agitated saline contrast was given intravenously to evaluate for intracardiac shunting.  LEFT VENTRICLE PLAX 2D LVIDd:         3.10 cm   Diastology LVIDs:         2.00 cm   LV e' medial:    40.16 cm/s LV PW:         1.00 cm   LV E/e' medial:  1.9 LV IVS:        0.90 cm   LV e' lateral:   7.07 cm/s LVOT diam:     2.00 cm   LV E/e' lateral: 10.7 LV SV:         35 LV SV Index:   23 LVOT Area:     3.14 cm  RIGHT VENTRICLE RV Basal diam:  2.80 cm RV Mid diam:    2.40 cm RV S prime:     21.80 cm/s TAPSE (M-mode): 2.1 cm LEFT ATRIUM  Index        RIGHT ATRIUM           Index LA diam:      2.60 cm 1.74 cm/m   RA Area:     10.90 cm LA Vol (A2C): 23.6 ml 15.82 ml/m  RA Volume:   21.90 ml  14.68 ml/m LA Vol (A4C): 12.1 ml 8.11 ml/m  AORTIC VALVE AV Area (Vmax):    2.41 cm AV Area (Vmean):   2.34 cm AV Area (VTI):     2.19 cm AV Vmax:           102.00 cm/s AV Vmean:          67.800 cm/s AV VTI:            0.159 m AV Peak Grad:      4.2 mmHg AV  Mean Grad:      2.0 mmHg LVOT Vmax:         78.20 cm/s LVOT Vmean:        50.400 cm/s LVOT VTI:          0.111 m LVOT/AV VTI ratio: 0.70  AORTA Ao Root diam: 3.30 cm MITRAL VALVE                TRICUSPID VALVE MV Area (PHT): 7.66 cm     TR Peak grad:   7.4 mmHg MV Decel Time: 99 msec      TR Vmax:        136.00 cm/s MV E velocity: 75.50 cm/s MV A velocity: 119.00 cm/s  SHUNTS MV E/A ratio:  0.63         Systemic VTI:  0.11 m                             Systemic Diam: 2.00 cm Lanell Pinta Custovic Electronically signed by Isabell Manzanilla Signature Date/Time: 04/05/2024/10:57:07 AM    Final      Family Communication: Discussed with patient, understand and agree. All questions answered.  Disposition: Status is: Inpatient Remains inpatient appropriate because: sepsis with low BP, need close monitoring.  Planned Discharge Destination: Skilled nursing facility return in 1-2 days on po Abx, most likely discharge tomorrow a.m.      Total time spent: 40 min   Author: Althia Atlas, MD 04/05/2024 5:11 PM Secure chat 7am to 7pm For on call review [email or web address].

## 2024-04-05 NOTE — Progress Notes (Signed)
*  PRELIMINARY RESULTS* Echocardiogram 2D Echocardiogram has been performed.  Mitchell [name] 04/05/2024, 7:56 AM

## 2024-04-05 NOTE — Progress Notes (Signed)
 PHARMACY NOTE:  ANTIMICROBIAL RENAL DOSAGE ADJUSTMENT  Current antimicrobial regimen includes a mismatch between antimicrobial dosage and estimated renal function.  As per policy approved by the Pharmacy & Therapeutics and Medical Executive Committees, the antimicrobial dosage will be adjusted accordingly.  Current antimicrobial dosage:  Cefazolin 1gm IV q 12 hours  Indication: Cellulitis  Renal Function:  Estimated Creatinine Clearance: 39.2 mL/min (A) (by C-G formula based on SCr of 1.59 mg/dL (H)).    Antimicrobial dosage has been changed to:  Cefazolin 1gm IV q 8 hrs  Additional comments:ID following; Dose adjusted for improved renal function. De-escalation and duration per ID recommendations.   Thank you for allowing pharmacy to be a part of this patient's care.  Javares Kaufhold Rodriguez-Guzman PharmD, BCPS 04/05/2024 11:35 AM

## 2024-04-06 ENCOUNTER — Other Ambulatory Visit: Payer: Self-pay

## 2024-04-06 DIAGNOSIS — L8994 Pressure ulcer of unspecified site, stage 4: Secondary | ICD-10-CM | POA: Diagnosis not present

## 2024-04-06 DIAGNOSIS — L089 Local infection of the skin and subcutaneous tissue, unspecified: Secondary | ICD-10-CM | POA: Diagnosis not present

## 2024-04-06 LAB — CBC
HCT: 31.1 % — ABNORMAL LOW (ref 39.0–52.0)
Hemoglobin: 9.9 g/dL — ABNORMAL LOW (ref 13.0–17.0)
MCH: 29.8 pg (ref 26.0–34.0)
MCHC: 31.8 g/dL (ref 30.0–36.0)
MCV: 93.7 fL (ref 80.0–100.0)
Platelets: 486 10*3/uL — ABNORMAL HIGH (ref 150–400)
RBC: 3.32 MIL/uL — ABNORMAL LOW (ref 4.22–5.81)
RDW: 15.6 % — ABNORMAL HIGH (ref 11.5–15.5)
WBC: 9.3 10*3/uL (ref 4.0–10.5)
nRBC: 0 % (ref 0.0–0.2)

## 2024-04-06 LAB — BASIC METABOLIC PANEL WITH GFR
Anion gap: 8 (ref 5–15)
BUN: 29 mg/dL — ABNORMAL HIGH (ref 6–20)
CO2: 26 mmol/L (ref 22–32)
Calcium: 9.2 mg/dL (ref 8.9–10.3)
Chloride: 99 mmol/L (ref 98–111)
Creatinine, Ser: 1.6 mg/dL — ABNORMAL HIGH (ref 0.61–1.24)
GFR, Estimated: 52 mL/min — ABNORMAL LOW (ref >60)
Glucose, Bld: 112 mg/dL — ABNORMAL HIGH (ref 70–99)
Potassium: 4.4 mmol/L (ref 3.5–5.1)
Sodium: 133 mmol/L — ABNORMAL LOW (ref 135–145)

## 2024-04-06 MED ORDER — POLYSACCHARIDE IRON COMPLEX 150 MG PO CAPS
150.0000 mg | ORAL_CAPSULE | Freq: Every day | ORAL | 2 refills | Status: AC
  Filled 2024-04-06: qty 30, 30d supply, fill #0

## 2024-04-06 MED ORDER — CYANOCOBALAMIN 1000 MCG PO TABS
1000.0000 ug | ORAL_TABLET | Freq: Every day | ORAL | 2 refills | Status: AC
  Filled 2024-04-06: qty 30, 30d supply, fill #0

## 2024-04-06 MED ORDER — ASCORBIC ACID 500 MG PO TABS
500.0000 mg | ORAL_TABLET | Freq: Every day | ORAL | 2 refills | Status: AC
  Filled 2024-04-06: qty 30, 30d supply, fill #0

## 2024-04-06 MED ORDER — CEPHALEXIN 500 MG PO CAPS
500.0000 mg | ORAL_CAPSULE | Freq: Four times a day (QID) | ORAL | 0 refills | Status: AC
  Filled 2024-04-06: qty 12, 3d supply, fill #0

## 2024-04-06 MED ORDER — POTASSIUM CHLORIDE CRYS ER 10 MEQ PO TBCR
10.0000 meq | EXTENDED_RELEASE_TABLET | Freq: Every day | ORAL | 0 refills | Status: AC
  Filled 2024-04-06: qty 7, 7d supply, fill #0

## 2024-04-06 MED ORDER — TORSEMIDE 20 MG PO TABS
20.0000 mg | ORAL_TABLET | Freq: Every day | ORAL | 0 refills | Status: AC
  Filled 2024-04-06: qty 7, 7d supply, fill #0

## 2024-04-06 NOTE — Progress Notes (Signed)
 Pt discharged to [facility] in stable condition via EMS. Discharge paperwork placed in packet. Report called to [place], Charity fundraiser. Pt with no immediate questions/concerns at this time

## 2024-04-06 NOTE — NC FL2 (Signed)
 [place]  MEDICAID FL2 LEVEL OF CARE FORM     IDENTIFICATION  Patient Name: Mitchell [name] Birthdate: Nov 11, 1971 Sex: male Admission Date (Current Location): 03/31/2024  [facility] and [place] Number:  Chiropodist and Address:  [facility], [address], [place], [place] [address]      Provider Number: [id number]  Attending Physician Name and Address:  Althia Atlas, MD  Relative Name and Phone Number:  Patient refused to give    Current Level of Care: Hospital Recommended Level of Care: Skilled Nursing Facility Prior Approval Number:  [id number]  Date Approved/Denied:   PASRR Number:    Discharge Plan: SNF    Current Diagnoses: Patient Active Problem List   Diagnosis Date Noted   Cellulitis of right leg 04/01/2024   Pressure injury of right buttock, stage 3 (HCC) 04/01/2024   Pressure injury of left buttock, stage 3 (HCC) 04/01/2024   Sacral osteomyelitis (HCC) 04/01/2024   Cellulitis 03/31/2024   Acute renal failure superimposed on stage 3a chronic kidney disease (HCC) 03/31/2024   Infected decubitus ulcer, stage IV (HCC) 03/31/2024   HTN (hypertension) 03/31/2024   Frequent UTI 03/31/2024   SIRS (systemic inflammatory response syndrome) (HCC) 03/31/2024   Obstructive sleep apnea 12/20/2021   Suprapubic catheter secondary to neurogenic bladder and urethral stricture (HCC) 03/21/2021   Hyperlipidemia 03/21/2021   Depression 03/21/2021   Chronic anemia 03/21/2021   Resides in long term care facility 01/07/2020   Spondylosis with myelopathy 12/02/2018   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   Spinal cord compression due to degenerative disorder of spinal column 12/02/2018   Chondrodystrophy 11/06/2018    Orientation RESPIRATION BLADDER Height & Weight     Self, Time, Situation, Place  Normal External catheter, Incontinent Weight: 56.7 kg Height:  4\' 10"  (147.3 cm)  BEHAVIORAL SYMPTOMS/MOOD NEUROLOGICAL BOWEL  NUTRITION STATUS  Other (Comment) (n/a)   Continent Diet (Regular)  AMBULATORY STATUS COMMUNICATION OF NEEDS Skin   Extensive Assist Verbally PU Stage and Appropriate Care (Full thickness to right buttocks and sacrum)       PU Stage 4 Dressing: BID               Personal Care Assistance Level of Assistance  Bathing, Feeding Bathing Assistance: Limited assistance Feeding assistance: Independent Dressing Assistance: Limited assistance     Functional Limitations Info  Sight, Hearing Sight Info: Adequate Hearing Info: Adequate      SPECIAL CARE FACTORS FREQUENCY                       Contractures Contractures Info: Not present    Additional Factors Info  Code Status, Allergies Code Status Info: Full Allergies Info: Nsaids, Fish Allergy, Shrimp Extract           Current Medications (04/06/2024):  This is the current hospital active medication list Current Facility-Administered Medications  Medication Dose Route Frequency Provider Last Rate Last Admin   acetaminophen  (TYLENOL ) tablet 650 mg  650 mg Oral Q6H PRN Duncan, Hazel V, MD       Or   acetaminophen  (TYLENOL ) suppository 650 mg  650 mg Rectal Q6H PRN Lanetta Pion, MD       ascorbic acid (VITAMIN C) tablet 500 mg  500 mg Oral BID Althia Atlas, MD   500 mg at 04/06/24 0949   ceFAZolin (ANCEF) IVPB 1 g/50 mL premix  1 g Intravenous Q8H Althia Atlas, MD 100 mL/hr at 04/06/24 0537 1 g  at 04/06/24 0537   cyanocobalamin (VITAMIN B12) injection 1,000 mcg  1,000 mcg Intramuscular Q1200 Althia Atlas, MD   1,000 mcg at 04/06/24 1206   Followed by   Cecily Cohen ON 04/10/2024] cyanocobalamin (VITAMIN B12) tablet 1,000 mcg  1,000 mcg Oral Daily Althia Atlas, MD       enoxaparin  (LOVENOX ) injection 40 mg  40 mg Subcutaneous Q24H Sreeram, Narendranath, MD   40 mg at 04/05/24 2120   feeding supplement (ENSURE ENLIVE / ENSURE PLUS) liquid 237 mL  237 mL Oral BID BM Aisha Hove, MD   237 mL at 04/04/24 1623    fenofibrate  tablet 54 mg  54 mg Oral Daily Duncan, Hazel V, MD   54 mg at 04/06/24 0950   fesoterodine  (TOVIAZ ) tablet 4 mg  4 mg Oral Daily Duncan, Hazel V, MD   4 mg at 04/06/24 8119   furosemide (LASIX) injection 20 mg  20 mg Intravenous BID Althia Atlas, MD   20 mg at 04/06/24 1478   HYDROcodone -acetaminophen  (NORCO/VICODIN) 5-325 MG per tablet 1-2 tablet  1-2 tablet Oral Q4H PRN Duncan, Hazel V, MD       iron polysaccharides (NIFEREX) capsule 150 mg  150 mg Oral Daily Althia Atlas, MD   150 mg at 04/06/24 0950   metoprolol  tartrate (LOPRESSOR ) tablet 25 mg  25 mg Oral BID Althia Atlas, MD   25 mg at 04/06/24 0950   morphine  (PF) 2 MG/ML injection 2 mg  2 mg Intravenous Q4H PRN Sreeram, Narendranath, MD       multivitamin with minerals tablet 1 tablet  1 tablet Oral Daily Althia Atlas, MD   1 tablet at 04/06/24 2956   ondansetron  (ZOFRAN ) tablet 4 mg  4 mg Oral Q6H PRN Duncan, Hazel V, MD       Or   ondansetron  (ZOFRAN ) injection 4 mg  4 mg Intravenous Q6H PRN Duncan, Hazel V, MD         Discharge Medications: Please see discharge summary for a list of discharge medications.  Relevant Imaging Results:  Relevant Lab Results:   Additional Information SSN [id number]  Baird Bombard, RN

## 2024-04-06 NOTE — Progress Notes (Signed)
 Patient refused AM dressing chance to Sacrum and buttocks.

## 2024-04-06 NOTE — Discharge Summary (Signed)
 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   Specialty: Internal Medicine Contact information: [address] [place] [address] [phone]                Diet recommendation: Regular diet  Activity: The patient is advised to gradually reintroduce usual activities, as tolerated  Discharge Condition: stable  Code Status: Full code   History of present illness: As per the H and P dictated on admission Hospital Course:  Mitchell [name] is a 53 y.o. male with medical history significant for Hypertension,  chondrodystrophy, spinal cord compression with neurogenic bladder and suprapubic catheter, frequent UTIs, chronic anemia, CKD 3 a,  chronic bilateral gluteus and sacral wounds with sacral osteomyelitis on MRI 03/16/24,  followed at the wound care center, with prior hospitalizations in the [facility] system, most recently 3/13 to 02/10/2024 with Proteus bacteremia of urinary source who was sent in by the wound care clinic for IV antibiotics due to infected appearance of his chronic stage IV sacral wounds in spite of outpatient treatment.    Assessment and Plan:   # Decubitus ulcer, stage IV  # Sacral osteomyelitis (MRI 03/06/24) with adjacent cellulitis # Stage IV decubitus ulcers  sacrum and right and left buttocks # Severe sepsis, criteria tachypnea, tachycardia, right lower extremity cellulitis and creatinine 2.1  Surgery team evaluation appreciated, bedside debridement done 04/01/24. S/p vancomycin  and Zosyn .  Encourage oral fluids, diet. ID consulted, DC'd Zosyn  and vancomycin , started Ancef 1 g IV 3 times daily for cellulitis of right lower extremity.  Recommended to transition to oral antibiotics on discharge for total 7-day course   # Cellulitis of right leg. S/p  Zosyn  and vancomycin  Keep leg elevated. ID consulted, recommended cefazolin IV, changed to oral antibiotics on discharge for total 7 days.  So patient is being discharged on Keflex 500 mg p.o. 3 times daily for 3 more days.   # Bilateral lower extremity edema Venous duplex right lower extremity negative for DVT 5/11 started on Lasix 40 mg IV twice daily TSH level 3.3 wnl, BNP 13 low 5/12 edema improved, patient received Lasix 40 mg dose in the morning, decreased to 20 mg IV twice daily TTE LVEF 60 to 65%, no  wall motion abnormality, no any other significant findings.  5/13 Lasix 20 mg IV twice daily.  5/14 edema is improving, started torsemide 20 mg p.o. daily and potassium chloride 10 mEq p.o. daily.  Repeat BMP after 1 week. Lower extremity edema could be secondary to hypoproteinemia and hypoalbuminemia.  Continue protein supplements   # Hypokalemia secondary to diuresis, potassium repleted.  Resolved # Acute renal failure superimposed on stage  3a chronic kidney disease:  Creatinine improved s/p fluids.  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 Blood culture NGTD. S/p broad-spectrum antibiotics.  Follow IR to exchange Foley catheter every month.     # HTN (hypertension) Beta-blocker was held due to low blood pressure 5/13 BP stable, patient has tachycardia, resumed Lopressor  25 mg p.o. 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 Chondrodystrophy: No acute issues.  Continue supportive care. He is bed bound.    # Anemia of chronic disease, iron and B12 deficiency  Hemoglobin low at 9.9 but stable # Vitamin B12 deficiency: Started vitamin B12 1000 mcg IM injection daily during hospital stay, followed by oral supplement.  Follow-up PCP to repeat vitamin B12 level after 3 to 6 months.   # Iron deficiency, transferrin saturation 13%, started oral iron supplement with vitamin C.  Follow with PCP repeat iron profile after 3 to 6 months     Body mass index is 26.13 kg/m.  Nutrition Problem: Increased nutrient needs Etiology: wound healing Nutrition Interventions: Interventions: Ensure Enlive (each supplement provides 350kcal and 20 grams of protein), MVI, Liberalize Diet  Pressure Injury 04/01/24 Buttocks Left Stage 4 - Full thickness tissue loss with exposed bone, tendon or muscle. (Active)  04/01/24 0120  Location: Buttocks  Location Orientation: Left  Staging: Stage 4 - Full thickness tissue loss with exposed bone, tendon or muscle.  Wound Description (Comments):   Present on Admission: Yes  Dressing Type Dakin's-soaked gauze 04/05/24 2219     Pressure Injury 04/01/24 Buttocks Right Stage  4 - Full thickness tissue loss with exposed bone, tendon or muscle. (Active)  04/01/24   Location: Buttocks  Location Orientation: Right  Staging: Stage 4 - Full thickness tissue loss with exposed bone, tendon or muscle.  Wound Description (Comments):   Present on Admission: Yes  Dressing Type Dakin's-soaked gauze 04/05/24 2219     Pressure Injury 04/01/24 Sacrum Stage 4 - Full thickness tissue loss with exposed bone, tendon or muscle. (Active)  04/01/24 0120  Location: Sacrum  Location Orientation:   Staging: Stage 4 - Full thickness tissue loss with exposed bone, tendon or muscle.  Wound Description (Comments):   Present on Admission: Yes  Dressing Type Dakin's-soaked gauze 04/05/24 2219     On the day of the discharge the patient's vitals were stable, and no other acute medical condition were reported by patient. the patient was felt safe to be discharge at [facility].  Consultants: ID and general surgery Procedures: Wound debridement at bedside  Discharge Exam: General: Appear in no distress, Oral Mucosa Clear, moist. Cardiovascular: S1 and S2 Present, no Murmur, Respiratory: normal respiratory effort, Bilateral Air entry present and no Crackles, no wheezes Abdomen: Bowel Sound present, Soft and no tenderness, no hernia Extremities: 2+ Pedal edema, RLE erythema and cellulitis almost resolved  Neurology: Paraplegic at baseline due to spinal cord compression, chondrodystrophic, bedbound. affect appropriate.  Filed Weights   03/31/24 1936  Weight: 56.7 kg   Vitals:   04/06/24 0543 04/06/24 0740  BP: 109/78 125/71  Pulse: 99 99  Resp: 16   Temp:  97.8 F (36.6 C)  SpO2: 95% 96%    DISCHARGE MEDICATION: Allergies as of 04/06/2024       Reactions   Nsaids    Fish Allergy Nausea And Vomiting   Shrimp Extract Nausea And Vomiting        Medication List     STOP taking these medications    ferrous sulfate  325 (65 FE) MG tablet       TAKE these medications     ascorbic acid 500 MG tablet Commonly known as: VITAMIN C Take 1 tablet (500 mg total) by mouth daily.   carbamide peroxide 6.5 % OTIC solution Commonly known as: DEBROX Place 5 drops into both ears every 8 (eight) hours as needed.   cephALEXin 500 MG capsule Commonly known as: KEFLEX Take 1 capsule (500 mg total) by mouth 4 (four) times daily for 3 days.   clotrimazole-betamethasone cream Commonly known as: LOTRISONE Apply 1 Application topically 2 (two) times daily.   cyanocobalamin 1000 MCG tablet Take 1 tablet (1,000 mcg total) by mouth daily. Start taking on: Apr 10, 2024   fenofibrate  48 MG tablet Commonly known as: TRICOR  Take 48 mg by mouth daily.   gabapentin  300 MG capsule Commonly known as: NEURONTIN  Take 300 mg by mouth 2 (two) times daily.   ipratropium-albuterol 0.5-2.5 (3) MG/3ML Soln Commonly known as: DUONEB Inhale 3 mLs into the lungs every 6 (six) hours as needed.   iron polysaccharides 150 MG capsule Commonly known as: NIFEREX Take 1 capsule (150 mg total) by mouth daily. Start taking on: Apr 07, 2024   ketoconazole 2 % cream Commonly known as: NIZORAL Apply 1 Application topically 2 (two) times daily.   ketoconazole 2 % shampoo Commonly known as: NIZORAL Apply 1 Application topically 2 (two) times a week.   metoprolol  tartrate 25 MG tablet Commonly known as: LOPRESSOR  Take 25 mg by mouth 2 (two) times daily.   ondansetron  4 MG disintegrating tablet Commonly known as: ZOFRAN -ODT Take 4 mg by mouth every 8 (eight) hours as needed for nausea or vomiting.   potassium chloride 10 MEQ tablet Commonly known as: KLOR-CON M Take 1 tablet (10 mEq total) by mouth daily for 7 days.   solifenacin 5 MG tablet Commonly known as: VESICARE Take 5 mg by mouth daily.   torsemide 20 MG tablet Commonly known as: DEMADEX Take 1 tablet (20 mg total) by mouth daily for 7 days.               Discharge Care Instructions  (From admission, onward)            Start     Ordered   04/06/24 0000  Discharge wound care:       Comments: As above   04/06/24 1054           Allergies  Allergen Reactions   Nsaids    Fish Allergy Nausea And Vomiting   Shrimp Extract Nausea And Vomiting   Discharge Instructions     Call MD for:  difficulty breathing, headache or visual disturbances   Complete by: As directed    Call MD for:  extreme fatigue   Complete by: As directed    Call MD for:  persistant dizziness or light-headedness   Complete by: As directed    Call MD for:  persistant nausea and vomiting   Complete by: As directed    Call MD for:  redness, tenderness, or signs of infection (pain, swelling, redness, odor or green/yellow discharge around incision site)   Complete by: As directed    Call MD for:  severe uncontrolled pain   Complete by: As directed    Call MD for:  temperature >100.4   Complete by: As directed    Diet - low sodium heart healthy   Complete by: As directed    Discharge instructions   Complete by: As directed    F/u PCP, need to be seen by an MD in 1-2 days BMP in 1 wk B12 and iron profile in 3-6 months  Foley Exchange every 28 days  Continue wound care and turn every 2 hrs   Discharge wound care:   Complete by: As directed    As above   Increase activity slowly   Complete by: As directed        The results of significant diagnostics from this hospitalization (including imaging, microbiology, ancillary and laboratory) are listed below for reference.    Significant Diagnostic Studies: ECHOCARDIOGRAM COMPLETE Result Date: 04/05/2024    ECHOCARDIOGRAM REPORT   Patient Name:   BRYLON DISABATINO Date of Exam: 04/05/2024 Medical Rec #:  [id number]      Height:       58.0 in Accession #:    [id number]     Weight:       125.0 lb Date of Birth:  Apr 11, 1971       BSA:          1.491 m Patient Age:    52 years       BP:           104/71 mmHg Patient Gender: M              HR:           122 bpm. Exam Location:   [facility] Procedure: 2D Echo, Cardiac Doppler, Color Doppler and Saline Contrast Bubble            Study (Both Spectral and Color Flow Doppler were utilized during            procedure). Indications:     CHF--acute systolic I50.21  History:         Patient has no prior history of Echocardiogram examinations.                  Risk Factors:Hypertension.  Sonographer:     Broadus Canes Referring Phys:  [id number] [facility] Diagnosing Phys: Sabina Custovic IMPRESSIONS  1. Left ventricular ejection fraction, by estimation, is 60 to 65%. The left ventricle has normal function. The left ventricle has no regional wall motion abnormalities. Left ventricular diastolic parameters were normal.  2. Right ventricular systolic function is normal. The right ventricular size is normal.  3. The mitral valve is normal in structure. No evidence of mitral valve regurgitation. No evidence of mitral stenosis.  4. The aortic valve is normal in structure. Aortic valve regurgitation is not visualized. No aortic stenosis is present.  5. The inferior vena cava is normal in size with greater than 50% respiratory variability, suggesting right atrial pressure of 3 mmHg. FINDINGS  Left Ventricle: Left ventricular ejection fraction, by estimation, is 60 to 65%. The left ventricle has normal function. The left ventricle has no  regional wall motion abnormalities. The left ventricular internal cavity size was normal in size. There is  no left ventricular hypertrophy. Left ventricular diastolic parameters were normal. Right Ventricle: The right ventricular size is normal. No increase in right ventricular wall thickness. Right ventricular systolic function is normal. Left Atrium: Left atrial size was normal in size. Right Atrium: Right atrial size was normal in size. Pericardium: There is no evidence of pericardial effusion. Mitral Valve: The mitral valve is normal in structure. No evidence of mitral valve regurgitation. No evidence of mitral valve stenosis.  Tricuspid Valve: The tricuspid valve is normal in structure. Tricuspid valve regurgitation is trivial. Aortic Valve: The aortic valve is normal in structure. Aortic valve regurgitation is not visualized. No aortic stenosis is present. Aortic valve mean gradient measures 2.0 mmHg. Aortic valve peak gradient measures 4.2 mmHg. Aortic valve area, by VTI measures 2.19 cm. Pulmonic Valve: The pulmonic valve was normal in structure. Pulmonic valve regurgitation is not visualized. Aorta: The aortic root is normal in size and structure. Venous: The inferior vena cava is normal in size with greater than 50% respiratory variability, suggesting right atrial pressure of 3 mmHg. IAS/Shunts: No atrial level shunt detected by color flow Doppler. Agitated saline contrast was given intravenously to evaluate for intracardiac shunting.  LEFT VENTRICLE PLAX 2D LVIDd:         3.10 cm   Diastology LVIDs:         2.00 cm   LV e' medial:    40.16 cm/s LV PW:         1.00 cm   LV E/e' medial:  1.9 LV IVS:        0.90 cm   LV e' lateral:   7.07 cm/s LVOT diam:     2.00 cm   LV E/e' lateral: 10.7 LV SV:         35 LV SV Index:   23 LVOT Area:     3.14 cm  RIGHT VENTRICLE RV Basal diam:  2.80 cm RV Mid diam:    2.40 cm RV S prime:     21.80 cm/s TAPSE (M-mode): 2.1 cm LEFT ATRIUM           Index        RIGHT ATRIUM           Index LA diam:      2.60 cm 1.74 cm/m   RA Area:     10.90 cm LA Vol (A2C): 23.6 ml 15.82 ml/m  RA Volume:   21.90 ml  14.68 ml/m LA Vol (A4C): 12.1 ml 8.11 ml/m  AORTIC VALVE AV Area (Vmax):    2.41 cm AV Area (Vmean):   2.34 cm AV Area (VTI):     2.19 cm AV Vmax:           102.00 cm/s AV Vmean:          67.800 cm/s AV VTI:            0.159 m AV Peak Grad:      4.2 mmHg AV Mean Grad:      2.0 mmHg LVOT Vmax:         78.20 cm/s LVOT Vmean:        50.400 cm/s LVOT VTI:          0.111 m LVOT/AV VTI ratio: 0.70  AORTA Ao Root diam: 3.30 cm MITRAL VALVE                TRICUSPID  VALVE MV Area (PHT): 7.66 cm     TR  Peak grad:   7.4 mmHg MV Decel Time: 99 msec      TR Vmax:        136.00 cm/s MV E velocity: 75.50 cm/s MV A velocity: 119.00 cm/s  SHUNTS MV E/A ratio:  0.63         Systemic VTI:  0.11 m                             Systemic Diam: 2.00 cm Lanell Pinta Custovic Electronically signed by Isabell Manzanilla Signature Date/Time: 04/05/2024/10:57:07 AM    Final    US  Venous Img Lower Unilateral Right Result Date: 03/31/2024 CLINICAL DATA:  Leg swelling. EXAM: RIGHT LOWER EXTREMITY VENOUS DOPPLER ULTRASOUND TECHNIQUE: Gray-scale sonography with compression, as well as color and duplex ultrasound, were performed to evaluate the deep venous system(s) from the level of the common femoral vein through the popliteal and proximal calf veins. COMPARISON:  None Available. FINDINGS: VENOUS Normal compressibility of the common femoral, superficial femoral, and popliteal veins, as well as the visualized calf veins. Visualized portions of profunda femoral vein and great saphenous vein unremarkable. No filling defects to suggest DVT on grayscale or color Doppler imaging. Doppler waveforms show normal direction of venous flow, normal respiratory plasticity and response to augmentation. Limited views of the contralateral common femoral vein are unremarkable. OTHER Complex hypoechoic area identified in the popliteal fossa. This could be a complex fluid collection or Baker's cyst measuring 2.8 x 1.0 x 1.6 cm Limitations: none IMPRESSION: No evidence of right lower extremity DVT. Complex fluid collection suspected in the popliteal fossa measuring 2.8 cm. Possible Baker's cyst but nonspecific. Please correlate with clinical findings and further workup as clinically appropriate Electronically Signed   By: Adrianna Horde M.D.   On: 03/31/2024 19:28   DG Chest Portable 1 View Result Date: 03/31/2024 CLINICAL DATA:  Sepsis. EXAM: PORTABLE CHEST 1 VIEW COMPARISON:  03/20/2021. FINDINGS: Cardiac silhouette is enlarged, unchanged. No focal consolidation,  pleural effusion, or pneumothorax. Fixation hardware again noted in the cervicothoracic and thoracolumbar spine. No acute osseous abnormality identified. IMPRESSION: No acute cardiopulmonary findings. Electronically Signed   By: Mannie Seek M.D.   On: 03/31/2024 16:39    Microbiology: Recent Results (from the past 240 hours)  Urine Culture     Status: Abnormal   Collection Time: 03/31/24  3:36 PM   Specimen: Urine, Random  Result Value Ref Range Status   Specimen Description   Final    URINE, RANDOM Performed at [facility], [address], [place], [place] [address]    Special Requests   Final    NONE Reflexed from [phone] Performed at [facility], [address] [place], [place] [address]    Culture MULTIPLE SPECIES PRESENT, SUGGEST RECOLLECTION (A)  Final   Report Status 04/01/2024 FINAL  Final  Blood culture (routine x 2)     Status: None   Collection Time: 03/31/24  7:36 PM   Specimen: BLOOD  Result Value Ref Range Status   Specimen Description BLOOD BLOOD LEFT HAND  Final   Special Requests   Final    BOTTLES DRAWN AEROBIC AND ANAEROBIC Blood Culture results may not be optimal due to an inadequate volume of blood received in culture bottles   Culture   Final    NO GROWTH 5 DAYS Performed at [facility], [address] [place],  [place] [address]    Report Status 04/05/2024 FINAL  Final  Blood culture (routine x 2)     Status: None   Collection Time: 03/31/24  7:39 PM   Specimen: BLOOD  Result Value Ref Range Status   Specimen Description BLOOD RIGHT ANTECUBITAL  Final   Special Requests   Final    BOTTLES DRAWN AEROBIC AND ANAEROBIC Blood Culture adequate volume   Culture   Final    NO GROWTH 5 DAYS Performed at [facility], [address] [place], [place] [address]    Report Status 04/05/2024 FINAL  Final     Labs: CBC: Recent Labs  Lab 03/31/24 1610 04/02/24 0742 04/03/24 0316 04/04/24 1048 04/05/24 0422  04/06/24 0312  WBC 10.2 4.6 5.5 5.7 8.4 9.3  NEUTROABS 8.7*  --   --   --   --   --   HGB 9.8* 8.7* 8.9* 9.7* 9.8* 9.9*  HCT 31.6* 27.6* 28.2* 30.6* 30.6* 31.1*  MCV 97.2 94.5 93.1 92.7 92.4 93.7  PLT 466* 338 388 426* 451* 486*   Basic Metabolic Panel: Recent Labs  Lab 04/02/24 0742 04/03/24 0316 04/03/24 1249 04/04/24 1048 04/05/24 0422 04/06/24 0312  NA 137 135  --  137 135 133*  K 3.6 3.6  --  4.0 3.2* 4.4  CL 106 104  --  100 96* 99  CO2 23 22  --  26 27 26   GLUCOSE 101* 125*  --  105* 111* 112*  BUN 24* 21*  --  20 25* 29*  CREATININE 1.75* 1.48* 1.44* 1.74* 1.59* 1.60*  CALCIUM 8.7* 8.9  --  9.2 9.3 9.2  MG  --   --   --  1.7 1.9  --   PHOS  --   --   --  3.7 4.9*  --    Liver Function Tests: Recent Labs  Lab 03/31/24 1610  AST 26  ALT 13  ALKPHOS 38  BILITOT 1.5*  PROT 7.2  ALBUMIN 2.6*   No results for input(s): "LIPASE", "AMYLASE" in the last 168 hours. No results for input(s): "AMMONIA" in the last 168 hours. Cardiac Enzymes: No results for input(s): "CKTOTAL", "CKMB", "CKMBINDEX", "TROPONINI" in the last 168 hours. BNP (last 3 results) Recent Labs    04/03/24 0316  BNP 13.0   CBG: No results for input(s): "GLUCAP" in the last 168 hours.  Time spent: 35 minutes  Signed:  Althia Atlas  [facility] 04/06/2024 11:15 AM

## 2024-04-06 NOTE — TOC Transition Note (Signed)
 Transition of Care [facility]) - Discharge Note   Patient Details  Name: Mitchell [name] MRN: [id number] Date of Birth: 28-May-1971  [facility]) CM/SW Contact:  Yolani Vo C Jovanni Rash, RN Phone Number: 04/06/2024, 2:28 PM   Clinical Narrative:    [name] with Rodger Civil in admissions at [facility] Per facility patient admission confirmed for today. Patient assigned room # 205 Nurse will call report to [phone] Face sheet and medical necessity forms printed to the floor to be added to the EMS pack EMS arranged "It'll be a couple of hours." Discharge summary and SNF transfer report sent in HUB.  Nurse, and patient notified TOC signing off.    Final next level of care: Skilled Nursing Facility Barriers to Discharge: Barriers Resolved   Patient Goals and CMS Choice Patient states their goals for this hospitalization and ongoing recovery are:: SNF CMS Medicare.gov Compare Post Acute Care list provided to:: Patient        Discharge Placement              Patient chooses bed at: Other - please specify in the comment section below: ([facility]) Patient to be transferred to facility by: [facility]   Patient and family notified of of transfer: 04/06/24  Discharge Plan and Services Additional resources added to the After Visit Summary for                                       Social Drivers of Health (SDOH) Interventions SDOH Screenings   Food Insecurity: No Food Insecurity (04/01/2024)  Housing: Low Risk  (04/01/2024)  Transportation Needs: No Transportation Needs (04/01/2024)  Utilities: Not At Risk (04/01/2024)  Financial Resource Strain: Low Risk  (02/04/2024)   Received from [facility]  Physical Activity: Inactive (02/04/2024)   Received from [facility]  Social Connections: Socially Isolated (04/01/2024)  Stress: No Stress Concern Present (02/04/2024)   Received from [facility]  Tobacco Use: High Risk (03/31/2024)  Health Literacy: Low Risk   (02/04/2024)   Received from [facility]     Readmission Risk Interventions     No data to display

## 2024-04-06 NOTE — Plan of Care (Signed)
  Problem: Clinical Measurements: Goal: Diagnostic test results will improve Outcome: Progressing   Problem: Clinical Measurements: Goal: Respiratory complications will improve Outcome: Progressing   Problem: Clinical Measurements: Goal: Cardiovascular complication will be avoided Outcome: Progressing   Problem: Activity: Goal: Risk for activity intolerance will decrease Outcome: Progressing   

## 2024-04-06 NOTE — Progress Notes (Signed)
 Report called to [facility] at [facility]. Will await EMS for transport

## 2024-04-08 LAB — VITAMIN A: Vitamin A (Retinoic Acid): 14.3 ug/dL — ABNORMAL LOW (ref 20.1–62.0)

## 2024-04-25 ENCOUNTER — Encounter: Attending: Physician Assistant | Admitting: Physician Assistant

## 2024-04-25 DIAGNOSIS — L89154 Pressure ulcer of sacral region, stage 4: Secondary | ICD-10-CM | POA: Insufficient documentation

## 2024-04-25 DIAGNOSIS — N183 Chronic kidney disease, stage 3 unspecified: Secondary | ICD-10-CM | POA: Diagnosis not present

## 2024-04-25 DIAGNOSIS — I129 Hypertensive chronic kidney disease with stage 1 through stage 4 chronic kidney disease, or unspecified chronic kidney disease: Secondary | ICD-10-CM | POA: Insufficient documentation

## 2024-04-25 DIAGNOSIS — Q774 Achondroplasia: Secondary | ICD-10-CM | POA: Insufficient documentation

## 2024-04-25 DIAGNOSIS — M4628 Osteomyelitis of vertebra, sacral and sacrococcygeal region: Secondary | ICD-10-CM | POA: Insufficient documentation

## 2024-04-25 DIAGNOSIS — L89314 Pressure ulcer of right buttock, stage 4: Secondary | ICD-10-CM | POA: Insufficient documentation

## 2024-04-25 DIAGNOSIS — L89892 Pressure ulcer of other site, stage 2: Secondary | ICD-10-CM | POA: Insufficient documentation

## 2024-04-25 DIAGNOSIS — L89324 Pressure ulcer of left buttock, stage 4: Secondary | ICD-10-CM | POA: Diagnosis not present

## 2024-05-02 ENCOUNTER — Ambulatory Visit: Admitting: Physician Assistant

## 2024-05-11 ENCOUNTER — Encounter: Admitting: Internal Medicine

## 2024-05-11 DIAGNOSIS — M4628 Osteomyelitis of vertebra, sacral and sacrococcygeal region: Secondary | ICD-10-CM | POA: Diagnosis not present

## 2024-05-23 ENCOUNTER — Ambulatory Visit: Admitting: Physician Assistant

## 2024-05-24 ENCOUNTER — Other Ambulatory Visit: Payer: Self-pay

## 2024-06-01 ENCOUNTER — Encounter: Attending: Physician Assistant | Admitting: Physician Assistant

## 2024-06-01 DIAGNOSIS — L89892 Pressure ulcer of other site, stage 2: Secondary | ICD-10-CM | POA: Insufficient documentation

## 2024-06-01 DIAGNOSIS — L89154 Pressure ulcer of sacral region, stage 4: Secondary | ICD-10-CM | POA: Insufficient documentation

## 2024-06-01 DIAGNOSIS — L89314 Pressure ulcer of right buttock, stage 4: Secondary | ICD-10-CM | POA: Diagnosis not present

## 2024-06-01 DIAGNOSIS — M4628 Osteomyelitis of vertebra, sacral and sacrococcygeal region: Secondary | ICD-10-CM | POA: Diagnosis present

## 2024-06-01 DIAGNOSIS — N183 Chronic kidney disease, stage 3 unspecified: Secondary | ICD-10-CM | POA: Insufficient documentation

## 2024-06-01 DIAGNOSIS — L89324 Pressure ulcer of left buttock, stage 4: Secondary | ICD-10-CM | POA: Insufficient documentation

## 2024-06-01 DIAGNOSIS — Q774 Achondroplasia: Secondary | ICD-10-CM | POA: Insufficient documentation

## 2024-06-01 DIAGNOSIS — I129 Hypertensive chronic kidney disease with stage 1 through stage 4 chronic kidney disease, or unspecified chronic kidney disease: Secondary | ICD-10-CM | POA: Insufficient documentation

## 2024-06-10 ENCOUNTER — Encounter: Admitting: Physician Assistant

## 2024-06-10 DIAGNOSIS — M4628 Osteomyelitis of vertebra, sacral and sacrococcygeal region: Secondary | ICD-10-CM | POA: Diagnosis not present

## 2024-06-30 ENCOUNTER — Encounter: Attending: Physician Assistant | Admitting: Physician Assistant

## 2024-06-30 DIAGNOSIS — L89324 Pressure ulcer of left buttock, stage 4: Secondary | ICD-10-CM | POA: Insufficient documentation

## 2024-06-30 DIAGNOSIS — L89314 Pressure ulcer of right buttock, stage 4: Secondary | ICD-10-CM | POA: Insufficient documentation

## 2024-06-30 DIAGNOSIS — Q774 Achondroplasia: Secondary | ICD-10-CM | POA: Insufficient documentation

## 2024-06-30 DIAGNOSIS — L89513 Pressure ulcer of right ankle, stage 3: Secondary | ICD-10-CM | POA: Diagnosis not present

## 2024-06-30 DIAGNOSIS — L89892 Pressure ulcer of other site, stage 2: Secondary | ICD-10-CM | POA: Diagnosis not present

## 2024-06-30 DIAGNOSIS — I129 Hypertensive chronic kidney disease with stage 1 through stage 4 chronic kidney disease, or unspecified chronic kidney disease: Secondary | ICD-10-CM | POA: Insufficient documentation

## 2024-06-30 DIAGNOSIS — L89154 Pressure ulcer of sacral region, stage 4: Secondary | ICD-10-CM | POA: Insufficient documentation

## 2024-06-30 DIAGNOSIS — M4628 Osteomyelitis of vertebra, sacral and sacrococcygeal region: Secondary | ICD-10-CM | POA: Diagnosis present

## 2024-06-30 DIAGNOSIS — N183 Chronic kidney disease, stage 3 unspecified: Secondary | ICD-10-CM | POA: Insufficient documentation

## 2024-07-21 ENCOUNTER — Encounter: Admitting: Internal Medicine

## 2024-07-21 DIAGNOSIS — M4628 Osteomyelitis of vertebra, sacral and sacrococcygeal region: Secondary | ICD-10-CM | POA: Diagnosis not present

## 2024-08-11 ENCOUNTER — Encounter: Attending: Physician Assistant | Admitting: Physician Assistant

## 2024-08-22 ENCOUNTER — Ambulatory Visit: Admitting: Physician Assistant

## 2024-08-24 ENCOUNTER — Other Ambulatory Visit: Payer: Self-pay | Admitting: Student

## 2024-08-29 ENCOUNTER — Ambulatory Visit: Admitting: Physician Assistant

## 2024-09-06 ENCOUNTER — Ambulatory Visit: Admitting: Physician Assistant

## 2024-09-13 ENCOUNTER — Encounter: Admitting: Physician Assistant

## 2024-09-22 ENCOUNTER — Encounter: Admitting: Physician Assistant
